# Patient Record
Sex: Female | Born: 1940
Health system: Southern US, Community
[De-identification: ages and names within clinical notes are randomized; demographics above are authoritative.]

## PROBLEM LIST (undated history)

## (undated) ENCOUNTER — Inpatient Hospital Stay (AMBULATORY_SURGERY_CENTER): Payer: Medicare Other | Admitting: Podiatry

## (undated) DIAGNOSIS — K743 Primary biliary cirrhosis: Secondary | ICD-10-CM

## (undated) DIAGNOSIS — I10 Essential (primary) hypertension: Secondary | ICD-10-CM

## (undated) DIAGNOSIS — M479 Spondylosis, unspecified: Secondary | ICD-10-CM

## (undated) DIAGNOSIS — E7800 Pure hypercholesterolemia, unspecified: Secondary | ICD-10-CM

## (undated) DIAGNOSIS — Z9889 Other specified postprocedural states: Secondary | ICD-10-CM

## (undated) DIAGNOSIS — E039 Hypothyroidism, unspecified: Secondary | ICD-10-CM

## (undated) DIAGNOSIS — K219 Gastro-esophageal reflux disease without esophagitis: Secondary | ICD-10-CM

## (undated) DIAGNOSIS — R112 Nausea with vomiting, unspecified: Secondary | ICD-10-CM

## (undated) DIAGNOSIS — N189 Chronic kidney disease, unspecified: Secondary | ICD-10-CM

## (undated) DIAGNOSIS — M797 Fibromyalgia: Secondary | ICD-10-CM

## (undated) DIAGNOSIS — D649 Anemia, unspecified: Secondary | ICD-10-CM

## (undated) HISTORY — DX: Spondylosis, unspecified: M47.9

## (undated) HISTORY — PX: BUNIONECTOMY: SHX129

## (undated) HISTORY — PX: TONSILLECTOMY: SUR1361

## (undated) HISTORY — PX: CHOLECYSTECTOMY: SHX55

## (undated) HISTORY — DX: Primary biliary cirrhosis: K74.3

## (undated) HISTORY — DX: Essential (primary) hypertension: I10

## (undated) HISTORY — PX: MASTOIDECTOMY: SHX711

## (undated) HISTORY — PX: APPENDECTOMY: SHX54

## (undated) HISTORY — DX: Fibromyalgia: M79.7

## (undated) HISTORY — PX: ABDOMINAL HYSTERECTOMY: SHX81

## (undated) HISTORY — PX: OTHER SURGICAL HISTORY: SHX169

## (undated) HISTORY — DX: Pure hypercholesterolemia, unspecified: E78.00

## (undated) HISTORY — DX: Hypothyroidism, unspecified: E03.9

---

## 2009-04-26 ENCOUNTER — Ambulatory Visit: Payer: Self-pay | Admitting: Surgery

## 2010-09-26 NOTE — Consult Note (Signed)
NEW PATIENT CONSULTATION   Jill Fletcher, Jill Fletcher  DOB:  09/02/1940                                        April 26, 2009  CHART #:  [id number]   REFERRING PHYSICIAN:  Dr. Ernestene Kiel.   REASON FOR CONSULTATION:  Interatrial septal aneurysm with mitral and  aortic regurgitation.   CLINICAL HISTORY:  I was asked by Dr. Laqueta Due to evaluate the patient  for the above problems.  She is a very nice 70 year old woman with a  history of hypertension and hypothyroidism as well as hyperlipidemia and  fibromyalgia who has had a lot of aches and pains over the years felt  secondary to fibromyalgia.  She describes having a cardiac  catheterization about 20 years ago when she presented with chest pain  and said that the catheterization was completely normal and she was  diagnosed with fibromyalgia.  She reports that over the past 6 months or  so she has had multiple episodes of double vision.  She said the worst  episode lasted for a couple days.  She had no other symptoms associated  with this.  She underwent a carotid ultrasound which showed no  significant stenosis.  She also underwent an MRI of brain that showed no  abnormality.  She had an echocardiogram performed at [facility]  on March 31, 2009.  This showed some bulging of the interatrial  septum suggesting possible aneurysmal changes.  No PFO was seen.  There  was mild mitral, tricuspid, and aortic insufficiency.  There was normal  left ventricular systolic function.  She brought a copy of the  echocardiogram with her today and I have reviewed that.  She was seen by  her ophthalmologist concerning this double vision and he could find no  definite cause for it although she does have a history of cataract.  She  also reports that over the past 6 months or so she has had increasing  exertional fatigue and shortness of breath.  She does report some chest  pains in multiple locations but said this is more  of an aching than a  pain and is not associated necessarily with any activity.  She felt that  these pains were most likely secondary to her fibromyalgia.   REVIEW OF SYSTEMS:  GENERAL:  She denies any fever or chills.  She has  had no recent weight changes.  She has a good appetite.  She does report  fatigue.  EYES:  As above.  She has had episodes of double vision and has a  history of cataracts.  ENT:  Negative.  ENDOCRINE:  She denies diabetes and does have a history of  hypothyroidism.  CARDIOVASCULAR:  She has intermittent aching chest pain in multiple  locations.  She said sometimes it feels like a pressure.  It has been  associated with exertion and at rest.  She does have exertional dyspnea.  She denies PND or orthopnea.  She has had no peripheral edema or  palpitations.  RESPIRATORY:  She denies cough and sputum production.  GI:  She has had no nausea or vomiting.  She denies melena or bright red  blood per rectum.  GU:  She denies dysuria and hematuria.  VASCULAR:  She denies claudication.  NEUROLOGIC:  She denies any focal weakness or numbness.  She does have a  history of headaches.  She has had no focal weakness or numbness.  She  has never had a TIA or a stroke.  MUSCULOSKELETAL:  She does report arthralgias and myalgias which are  chronic.  PSYCHIATRIC:  Negative.  HEMATOLOGIC:  Negative.   PAST MEDICAL HISTORY:  Significant for fibromyalgia for many years.  She  has a history of primary biliary cirrhosis.  She has had history of  hypercholesterolemia but has not been treated with statins due to her  primary biliary cirrhosis.  She has a history of hypertension.  She has  a history of hypothyroidism.  She has had 4 back surgeries in the past.   SOCIAL HISTORY:  She is married and lives with her husband.  She has  never smoked.  Denies alcohol use.   FAMILY HISTORY:  Negative for cardiac disease.   ALLERGIES:  Penicillin, codeine, mycins, and cyclins.    MEDICATIONS:  1. Protonix 40 mg daily.  2. Aspirin 81 mg daily.  3. Omega-3 1000 mg b.i.d.  4. Multivitamin daily.  5. Calcium plus D 200 mg b.i.d.  6. Vitamin C 500 mg daily.  7. Toprol-XL 50 mg daily.  8. Levothyroxine 75 mcg daily.  9. Magnesium 250 mg daily.  10.B complex vitamin daily.  11.Vitamin D3 daily.  12.Fiber.  13.She is also on Urso Forte 500 mg b.i.d.  14.Vivelle-Dot 0.05 mg for 24-hour patch biweekly.   PHYSICAL EXAMINATION:  Vital Signs:  Blood pressure 132/77, pulse 66 and  regular, respiratory rate 16 and unlabored.  Oxygen saturation on room  air is 98%.  General:  She is a well-developed white female in no  distress.  HEENT:  Normocephalic and atraumatic.  Pupils are equal and  reactive to light and accommodation.  Extraocular muscles are intact.  Her throat is clear.  Neck:  Normal carotid pulses bilaterally.  There  are no bruits.  There is no adenopathy or thyromegaly.  Cardiac:  Regular rate and rhythm with normal S1 and S2.  There is no murmur, rub,  or gallop.  Lungs:  Clear.  Abdomen:  Active bowel sounds.  Abdomen is  soft and nontender.  There are no palpable masses or organomegaly.  Back:  Scar over her lower spine from previous surgery.  Extremities:  No peripheral edema.  Pedal pulses are palpable bilaterally.  Skin:  Warm and dry.  Neurologic:  Alert and oriented x3.  Motor and sensory  exams are grossly normal.   IMPRESSION:  The patient has a bulging interatrial septum that may be  aneurysmal, but there is no evidence of a PFO or AST and I do not think  this requires any intervention.  She has very mild regurgitation through  her mitral, aortic, and tricuspid valves which does not require  intervention.  She does have significant symptoms of exertional fatigue  and shortness of breath which have been worsening.  These certainly  could be due to her fibromyalgia, but I think it is probably wise to  assess her for a cardiac etiology given her  history of hypertension and  untreated hyperlipidemia.  I discussed this with her and her husband,  and we decided to refer her to cardiology for further evaluation which  may require a stress test.  I have set up an appointment for her to see  Dr. Peter Martinique with [facility] in the near future.  I told  her I did not think that this interatrial septal aneurysm has anything  to  do with her blurred vision.  The etiology of her visual changes is  unclear.  If she had a patent foramen ovale, I would be more concerned  about possible embolic emboli with a history of blurred vision and  headaches but that was not seen by echocardiogram.  She is planning on  continuing to follow up with her ophthalmologist concerning her vision  and said that he may decide to operate on her cataracts.  I will await  Dr. Doug Sou consultation.   Gilford Raid, M.D.  Electronically Signed   BB/MEDQ  D:  04/26/2009  T:  04/27/2009  Job:  [id number]   cc:   Caroline Prochnau  Peter M. Martinique, M.D.

## 2011-06-11 DIAGNOSIS — I10 Essential (primary) hypertension: Secondary | ICD-10-CM | POA: Diagnosis not present

## 2011-06-11 DIAGNOSIS — K219 Gastro-esophageal reflux disease without esophagitis: Secondary | ICD-10-CM | POA: Diagnosis not present

## 2011-06-11 DIAGNOSIS — E039 Hypothyroidism, unspecified: Secondary | ICD-10-CM | POA: Diagnosis not present

## 2011-06-27 DIAGNOSIS — R1011 Right upper quadrant pain: Secondary | ICD-10-CM | POA: Diagnosis not present

## 2011-06-27 DIAGNOSIS — R1084 Generalized abdominal pain: Secondary | ICD-10-CM | POA: Diagnosis not present

## 2011-06-27 DIAGNOSIS — N3000 Acute cystitis without hematuria: Secondary | ICD-10-CM | POA: Diagnosis not present

## 2011-08-03 DIAGNOSIS — R110 Nausea: Secondary | ICD-10-CM | POA: Diagnosis not present

## 2011-08-03 DIAGNOSIS — R109 Unspecified abdominal pain: Secondary | ICD-10-CM | POA: Diagnosis not present

## 2011-08-08 DIAGNOSIS — R109 Unspecified abdominal pain: Secondary | ICD-10-CM | POA: Diagnosis not present

## 2011-08-08 DIAGNOSIS — R112 Nausea with vomiting, unspecified: Secondary | ICD-10-CM | POA: Diagnosis not present

## 2011-09-17 DIAGNOSIS — N3000 Acute cystitis without hematuria: Secondary | ICD-10-CM | POA: Diagnosis not present

## 2011-09-26 DIAGNOSIS — R51 Headache: Secondary | ICD-10-CM | POA: Diagnosis not present

## 2011-10-03 DIAGNOSIS — M25569 Pain in unspecified knee: Secondary | ICD-10-CM | POA: Diagnosis not present

## 2011-10-04 DIAGNOSIS — R945 Abnormal results of liver function studies: Secondary | ICD-10-CM | POA: Diagnosis not present

## 2011-10-24 ENCOUNTER — Encounter: Payer: Self-pay | Admitting: Cardiology

## 2011-11-01 DIAGNOSIS — J189 Pneumonia, unspecified organism: Secondary | ICD-10-CM | POA: Diagnosis not present

## 2011-11-01 DIAGNOSIS — Z883 Allergy status to other anti-infective agents status: Secondary | ICD-10-CM | POA: Diagnosis not present

## 2011-11-01 DIAGNOSIS — E039 Hypothyroidism, unspecified: Secondary | ICD-10-CM | POA: Diagnosis not present

## 2011-11-01 DIAGNOSIS — J069 Acute upper respiratory infection, unspecified: Secondary | ICD-10-CM | POA: Diagnosis not present

## 2011-11-01 DIAGNOSIS — I10 Essential (primary) hypertension: Secondary | ICD-10-CM | POA: Diagnosis not present

## 2011-11-01 DIAGNOSIS — R05 Cough: Secondary | ICD-10-CM | POA: Diagnosis not present

## 2011-11-01 DIAGNOSIS — K745 Biliary cirrhosis, unspecified: Secondary | ICD-10-CM | POA: Diagnosis not present

## 2011-11-01 DIAGNOSIS — K219 Gastro-esophageal reflux disease without esophagitis: Secondary | ICD-10-CM | POA: Diagnosis not present

## 2011-11-01 DIAGNOSIS — Z885 Allergy status to narcotic agent status: Secondary | ICD-10-CM | POA: Diagnosis not present

## 2011-11-01 DIAGNOSIS — Z880 Allergy status to penicillin: Secondary | ICD-10-CM | POA: Diagnosis not present

## 2011-11-30 DIAGNOSIS — Z1231 Encounter for screening mammogram for malignant neoplasm of breast: Secondary | ICD-10-CM | POA: Diagnosis not present

## 2011-12-03 DIAGNOSIS — R300 Dysuria: Secondary | ICD-10-CM | POA: Diagnosis not present

## 2012-02-01 DIAGNOSIS — Z23 Encounter for immunization: Secondary | ICD-10-CM | POA: Diagnosis not present

## 2012-02-15 DIAGNOSIS — E039 Hypothyroidism, unspecified: Secondary | ICD-10-CM | POA: Diagnosis not present

## 2012-03-26 DIAGNOSIS — J069 Acute upper respiratory infection, unspecified: Secondary | ICD-10-CM | POA: Diagnosis not present

## 2012-06-05 DIAGNOSIS — H2589 Other age-related cataract: Secondary | ICD-10-CM | POA: Diagnosis not present

## 2012-06-27 DIAGNOSIS — M1710 Unilateral primary osteoarthritis, unspecified knee: Secondary | ICD-10-CM | POA: Diagnosis not present

## 2012-07-08 ENCOUNTER — Encounter: Payer: Self-pay | Admitting: Cardiology

## 2012-07-11 DIAGNOSIS — M1710 Unilateral primary osteoarthritis, unspecified knee: Secondary | ICD-10-CM | POA: Diagnosis not present

## 2012-07-23 DIAGNOSIS — Z01818 Encounter for other preprocedural examination: Secondary | ICD-10-CM | POA: Diagnosis not present

## 2012-07-23 DIAGNOSIS — I10 Essential (primary) hypertension: Secondary | ICD-10-CM | POA: Diagnosis not present

## 2012-07-23 DIAGNOSIS — K745 Biliary cirrhosis, unspecified: Secondary | ICD-10-CM | POA: Diagnosis not present

## 2012-07-23 DIAGNOSIS — E039 Hypothyroidism, unspecified: Secondary | ICD-10-CM | POA: Diagnosis not present

## 2012-07-23 DIAGNOSIS — J019 Acute sinusitis, unspecified: Secondary | ICD-10-CM | POA: Diagnosis not present

## 2012-07-30 ENCOUNTER — Encounter (HOSPITAL_COMMUNITY): Payer: Self-pay | Admitting: Pharmacy Technician

## 2012-08-04 NOTE — Progress Notes (Signed)
Surgery clearance note Dr. Laqueta Due 07/23/12 on chart

## 2012-08-05 ENCOUNTER — Encounter (HOSPITAL_COMMUNITY): Payer: Self-pay

## 2012-08-05 ENCOUNTER — Other Ambulatory Visit (HOSPITAL_COMMUNITY): Payer: Self-pay | Admitting: Orthopedic Surgery

## 2012-08-05 ENCOUNTER — Encounter (HOSPITAL_COMMUNITY)
Admission: RE | Admit: 2012-08-05 | Discharge: 2012-08-05 | Disposition: A | Discharge status: Home / Self Care | Payer: Medicare Other | Source: Ambulatory Visit | Attending: Orthopedic Surgery | Admitting: Orthopedic Surgery

## 2012-08-05 ENCOUNTER — Ambulatory Visit (HOSPITAL_COMMUNITY)
Admission: RE | Admit: 2012-08-05 | Discharge: 2012-08-05 | Disposition: A | Discharge status: Home / Self Care | Payer: Medicare Other | Source: Ambulatory Visit | Attending: Orthopedic Surgery | Admitting: Orthopedic Surgery

## 2012-08-05 DIAGNOSIS — Z981 Arthrodesis status: Secondary | ICD-10-CM | POA: Insufficient documentation

## 2012-08-05 DIAGNOSIS — Z01818 Encounter for other preprocedural examination: Secondary | ICD-10-CM | POA: Insufficient documentation

## 2012-08-05 DIAGNOSIS — Z01812 Encounter for preprocedural laboratory examination: Secondary | ICD-10-CM | POA: Insufficient documentation

## 2012-08-05 DIAGNOSIS — Z9089 Acquired absence of other organs: Secondary | ICD-10-CM | POA: Diagnosis not present

## 2012-08-05 HISTORY — DX: Nausea with vomiting, unspecified: R11.2

## 2012-08-05 HISTORY — DX: Other specified postprocedural states: Z98.890

## 2012-08-05 HISTORY — DX: Gastro-esophageal reflux disease without esophagitis: K21.9

## 2012-08-05 LAB — PROTIME-INR
INR: 0.87 (ref 0.00–1.49)
Prothrombin Time: 11.8 seconds (ref 11.6–15.2)

## 2012-08-05 LAB — URINALYSIS, ROUTINE W REFLEX MICROSCOPIC
Bilirubin Urine: NEGATIVE
Specific Gravity, Urine: 1.012 (ref 1.005–1.030)
pH: 6.5 (ref 5.0–8.0)

## 2012-08-05 LAB — ABO/RH: ABO/RH(D): O POS

## 2012-08-05 LAB — URINE MICROSCOPIC-ADD ON

## 2012-08-05 MED ORDER — CHLORHEXIDINE GLUCONATE 4 % EX LIQD: 60.0000 mL | Freq: Once | CUTANEOUS | Status: DC

## 2012-08-05 NOTE — Patient Instructions (Addendum)
20 Jill Fletcher  08/05/2012   Your procedure is scheduled on: 08-12-2012  Report to [facility] at [place] AM.  Call this number if you have problems the morning of surgery [phone]   Remember:   Do not eat food or drink liquids :After Midnight.      Take these medicines the morning of surgery with A SIP OF WATER: toprol xl, synthroid, protonix, ursodiol                                SEE [place] PREPARING FOR SURGERY SHEET   Do not wear jewelry, make-up or nail polish.  Do not wear lotions, powders, or perfumes. You may wear deodorant.   Men may shave face and neck.  Do not bring valuables to the hospital.  Contacts, dentures or bridgework may not be worn into surgery.  Leave suitcase in the car. After surgery it may be brought to your room.  For patients admitted to the hospital, checkout time is 11:00 AM the day of discharge.   Patients discharged the day of surgery will not be allowed to drive home.  Name and phone number of your driver:  Special Instructions: N/A   Please read over the following fact sheets that you were given: MRSA Information, blood fact sheet, incentive spirometer fact sheet  Call Zelphia Cairo RN pre op nurse if needed [phone]    [place]. PATIENT SIGNATURE___________________________________________

## 2012-08-05 NOTE — Progress Notes (Signed)
Micro, ua results faxed to dr Alvan Dame by epic

## 2012-08-05 NOTE — Progress Notes (Addendum)
ekg 3-12 2014 dr Trenton Founds on chart,  Cbc with dif, and cmey 3-12 2-14 dr Baltazar Apo on chart  Back xray 08-03-2004 results placed on pt chart per dr Alvan Dame request

## 2012-08-10 NOTE — H&P (Signed)
TOTAL KNEE ADMISSION H&P  Patient is being admitted for left total knee arthroplasty.  Subjective:  Chief Complaint:  Left knee OA / pain.  HPI: Jill Fletcher, 72 y.o. female, has a history of pain and functional disability in the left knee due to arthritis and has failed non-surgical conservative treatments for greater than 12 weeks to includeNSAID's and/or analgesics and activity modification.  Onset of symptoms was gradual, starting 3 years ago with gradually worsening course since that time. The patient noted no past surgery on the left knee(s).  Patient currently rates pain in the left knee(s) at 8 out of 10 with activity. Patient has night pain, worsening of pain with activity and weight bearing, pain that interferes with activities of daily living, pain with passive range of motion, crepitus and joint swelling.  Patient has evidence of periarticular osteophytes and joint space narrowing by imaging studies. There is no active infection.  Risks, benefits and expectations were discussed with the patient. Patient understand the risks, benefits and expectations and wishes to proceed with surgery.   D/C Plans:   Home with HHPT  Post-op Meds:    Rx given for ASA, Robaxin, Iron, Colace and MiraLax  Tranexamic Acid:   To be given  Decadron:    To be given  FYI:    Lots of allergies, otherwise nothing to note.   Past Medical History  Diagnosis Date  . Hypertension   . Hypercholesteremia   . Primary biliary cirrhosis   . Hypothyroidism   . Osteoarthritis of spine   . Fibromyalgia   . GERD (gastroesophageal reflux disease)   . PONV (postoperative nausea and vomiting)     yrs ago, 08-03-2004 back xray on chart per dr Alvan Dame request    Past Surgical History  Procedure Laterality Date  . Tonsillectomy    . Appendectomy    . Hysterectomy----unknown    . Cholecystectomy    . Bunionectomy Bilateral yrs ago  . Mastoidectomy    . Lumbar surgery ----- unknown    . Right shoulder surgery   yrs ago  . Bladder tach  yrs ago  . Abdominal hysterectomy      No prescriptions prior to admission   Allergies  Allergen Reactions  . Doxycycline     And like medications. Heart pappitations and itching  . Levaquin (Levofloxacin) Other (See Comments)    Heart pounding and itching   . Other Itching    ALL MYCINS & CILLINS AND CYCLINS-CANT SLEEP AND HEART POUNDING  . Penicillins Other (See Comments)    Whelps at injections site and itching   . Codeine Palpitations    History  Substance Use Topics  . Smoking status: Never Smoker   . Smokeless tobacco: Never Used  . Alcohol Use: No    Family History  Problem Relation Age of Onset  . Diabetes Father   . Hypertension Mother   . Arthritis Mother   . Diabetes Brother      Review of Systems  Constitutional: Positive for chills.  HENT: Negative.   Eyes: Negative.   Respiratory: Negative.   Cardiovascular: Negative.   Gastrointestinal: Negative.   Genitourinary: Positive for urgency and frequency.  Musculoskeletal: Positive for myalgias, back pain and joint pain.  Skin: Negative.   Neurological: Negative.   Endo/Heme/Allergies: Negative.   Psychiatric/Behavioral: Negative.     Objective:  Physical Exam  Constitutional: She is oriented to person, place, and time. She appears well-developed and well-nourished.  HENT:  Head: Normocephalic and atraumatic.  Mouth/Throat: Oropharynx is clear and moist.  Eyes: Pupils are equal, round, and reactive to light.  Neck: Neck supple. No JVD present. No tracheal deviation present. No thyromegaly present.  Cardiovascular: Normal rate, regular rhythm, normal heart sounds and intact distal pulses.   Respiratory: Effort normal and breath sounds normal. No stridor. No respiratory distress. She has no wheezes.  GI: Soft. There is no tenderness. There is no guarding.  Musculoskeletal:       Left knee: She exhibits decreased range of motion, swelling, abnormal alignment (valgus alignment)  and bony tenderness. She exhibits no effusion, no ecchymosis, no laceration and no erythema. Tenderness found.  Lymphadenopathy:    She has no cervical adenopathy.  Neurological: She is alert and oriented to person, place, and time.  Skin: Skin is warm and dry.  Psychiatric: She has a normal mood and affect.     Imaging Review Plain radiographs demonstrate severe degenerative joint disease of the left knee(s). The overall alignment issignificant valgus. The bone quality appears to be good for age and reported activity level.  Assessment/Plan:  End stage arthritis, left knee   The patient history, physical examination, clinical judgment of the provider and imaging studies are consistent with end stage degenerative joint disease of the left knee(s) and total knee arthroplasty is deemed medically necessary. The treatment options including medical management, injection therapy arthroscopy and arthroplasty were discussed at length. The risks and benefits of total knee arthroplasty were presented and reviewed. The risks due to aseptic loosening, infection, stiffness, patella tracking problems, thromboembolic complications and other imponderables were discussed. The patient acknowledged the explanation, agreed to proceed with the plan and consent was signed. Patient is being admitted for inpatient treatment for surgery, pain control, PT, OT, prophylactic antibiotics, VTE prophylaxis, progressive ambulation and ADL's and discharge planning. The patient is planning to be discharged home with home health services.   [place] Aneesh Faller   PAC  08/10/2012, 7:31 PM

## 2012-08-12 ENCOUNTER — Encounter (HOSPITAL_COMMUNITY): Payer: Self-pay | Admitting: Anesthesiology

## 2012-08-12 ENCOUNTER — Encounter (HOSPITAL_COMMUNITY)
Admission: RE | Disposition: A | Discharge status: Home Health | Payer: Self-pay | Source: Ambulatory Visit | Attending: Orthopedic Surgery

## 2012-08-12 ENCOUNTER — Inpatient Hospital Stay (HOSPITAL_COMMUNITY)
Admission: RE | Admit: 2012-08-12 | Discharge: 2012-08-13 | DRG: 470 | Disposition: A | Discharge status: Home Health | Payer: Medicare Other | Source: Ambulatory Visit | Attending: Orthopedic Surgery | Admitting: Orthopedic Surgery

## 2012-08-12 ENCOUNTER — Encounter (HOSPITAL_COMMUNITY): Payer: Self-pay | Admitting: *Deleted

## 2012-08-12 ENCOUNTER — Inpatient Hospital Stay (HOSPITAL_COMMUNITY): Payer: Medicare Other | Admitting: Anesthesiology

## 2012-08-12 DIAGNOSIS — IMO0001 Reserved for inherently not codable concepts without codable children: Secondary | ICD-10-CM | POA: Diagnosis present

## 2012-08-12 DIAGNOSIS — K219 Gastro-esophageal reflux disease without esophagitis: Secondary | ICD-10-CM | POA: Diagnosis present

## 2012-08-12 DIAGNOSIS — E039 Hypothyroidism, unspecified: Secondary | ICD-10-CM | POA: Diagnosis present

## 2012-08-12 DIAGNOSIS — E7800 Pure hypercholesterolemia, unspecified: Secondary | ICD-10-CM | POA: Diagnosis present

## 2012-08-12 DIAGNOSIS — M1710 Unilateral primary osteoarthritis, unspecified knee: Principal | ICD-10-CM | POA: Diagnosis present

## 2012-08-12 DIAGNOSIS — E663 Overweight: Secondary | ICD-10-CM | POA: Diagnosis present

## 2012-08-12 DIAGNOSIS — E871 Hypo-osmolality and hyponatremia: Secondary | ICD-10-CM | POA: Diagnosis not present

## 2012-08-12 DIAGNOSIS — Z6825 Body mass index (BMI) 25.0-25.9, adult: Secondary | ICD-10-CM

## 2012-08-12 DIAGNOSIS — I10 Essential (primary) hypertension: Secondary | ICD-10-CM | POA: Diagnosis present

## 2012-08-12 DIAGNOSIS — IMO0002 Reserved for concepts with insufficient information to code with codable children: Secondary | ICD-10-CM | POA: Diagnosis not present

## 2012-08-12 DIAGNOSIS — Z96659 Presence of unspecified artificial knee joint: Secondary | ICD-10-CM

## 2012-08-12 DIAGNOSIS — Z96652 Presence of left artificial knee joint: Secondary | ICD-10-CM

## 2012-08-12 HISTORY — PX: TOTAL KNEE ARTHROPLASTY: SHX125

## 2012-08-12 LAB — TYPE AND SCREEN: Antibody Screen: NEGATIVE

## 2012-08-12 SURGERY — ARTHROPLASTY, KNEE, TOTAL
Anesthesia: Spinal | Site: Knee | Laterality: Left | Wound class: Clean

## 2012-08-12 MED ORDER — PROMETHAZINE HCL 25 MG/ML IJ SOLN: 6.2500 mg | INTRAMUSCULAR | Status: DC | PRN

## 2012-08-12 MED ORDER — TRAMADOL HCL 50 MG PO TABS: 50.0000 mg | ORAL_TABLET | Freq: Four times a day (QID) | ORAL | Status: DC | PRN

## 2012-08-12 MED ORDER — DIPHENHYDRAMINE HCL 25 MG PO CAPS: 25.0000 mg | ORAL_CAPSULE | Freq: Four times a day (QID) | ORAL | Status: DC | PRN

## 2012-08-12 MED ORDER — SODIUM CHLORIDE 0.9 % IR SOLN
Status: DC | PRN
  Administered 2012-08-12: 1000 mL

## 2012-08-12 MED ORDER — POLYETHYLENE GLYCOL 3350 17 G PO PACK
17.0000 g | PACK | Freq: Two times a day (BID) | ORAL | Status: DC
  Administered 2012-08-12 – 2012-08-13 (×2): 17 g via ORAL

## 2012-08-12 MED ORDER — MEPERIDINE HCL 50 MG/ML IJ SOLN: 6.2500 mg | INTRAMUSCULAR | Status: DC | PRN

## 2012-08-12 MED ORDER — CEFAZOLIN SODIUM-DEXTROSE 2-3 GM-% IV SOLR
2.0000 g | Freq: Once | INTRAVENOUS | Status: AC
  Administered 2012-08-12: 2 g via INTRAVENOUS

## 2012-08-12 MED ORDER — ACETAMINOPHEN 10 MG/ML IV SOLN
INTRAVENOUS | Status: AC
  Filled 2012-08-12: qty 100

## 2012-08-12 MED ORDER — LACTATED RINGERS IV SOLN
INTRAVENOUS | Status: DC | PRN
  Administered 2012-08-12 (×2): via INTRAVENOUS

## 2012-08-12 MED ORDER — URSODIOL 300 MG PO CAPS
300.0000 mg | ORAL_CAPSULE | Freq: Two times a day (BID) | ORAL | Status: DC
  Administered 2012-08-12 – 2012-08-13 (×2): 300 mg via ORAL
  Filled 2012-08-12 (×3): qty 1

## 2012-08-12 MED ORDER — RIVAROXABAN 10 MG PO TABS
10.0000 mg | ORAL_TABLET | ORAL | Status: DC
  Administered 2012-08-13: 10 mg via ORAL
  Filled 2012-08-12 (×2): qty 1

## 2012-08-12 MED ORDER — TRANEXAMIC ACID 100 MG/ML IV SOLN
1000.0000 mg | Freq: Once | INTRAVENOUS | Status: AC
  Administered 2012-08-12: 1000 mg via INTRAVENOUS
  Filled 2012-08-12: qty 10

## 2012-08-12 MED ORDER — FERROUS SULFATE 325 (65 FE) MG PO TABS
325.0000 mg | ORAL_TABLET | Freq: Three times a day (TID) | ORAL | Status: DC
  Administered 2012-08-12 – 2012-08-13 (×2): 325 mg via ORAL
  Filled 2012-08-12 (×5): qty 1

## 2012-08-12 MED ORDER — URSODIOL 60 MG/ML SUSP
500.0000 mg | Freq: Two times a day (BID) | ORAL | Status: DC
  Filled 2012-08-12: qty 16.7

## 2012-08-12 MED ORDER — PHENYLEPHRINE HCL 10 MG/ML IJ SOLN
INTRAMUSCULAR | Status: DC | PRN
  Administered 2012-08-12: 40 ug via INTRAVENOUS

## 2012-08-12 MED ORDER — PANTOPRAZOLE SODIUM 40 MG PO TBEC
40.0000 mg | DELAYED_RELEASE_TABLET | Freq: Every morning | ORAL | Status: DC
  Administered 2012-08-13: 40 mg via ORAL
  Filled 2012-08-12: qty 1

## 2012-08-12 MED ORDER — LEVOTHYROXINE SODIUM 75 MCG PO TABS
75.0000 ug | ORAL_TABLET | Freq: Every day | ORAL | Status: DC
  Administered 2012-08-13: 75 ug via ORAL
  Filled 2012-08-12 (×2): qty 1

## 2012-08-12 MED ORDER — KETOROLAC TROMETHAMINE 15 MG/ML IJ SOLN
7.5000 mg | Freq: Four times a day (QID) | INTRAMUSCULAR | Status: DC
  Administered 2012-08-12 – 2012-08-13 (×3): 7.5 mg via INTRAVENOUS
  Filled 2012-08-12 (×4): qty 1

## 2012-08-12 MED ORDER — SODIUM CHLORIDE 0.9 % IV SOLN
INTRAVENOUS | Status: DC
  Administered 2012-08-12 – 2012-08-13 (×2): via INTRAVENOUS
  Filled 2012-08-12 (×5): qty 1000

## 2012-08-12 MED ORDER — HYDROMORPHONE HCL PF 1 MG/ML IJ SOLN
0.5000 mg | INTRAMUSCULAR | Status: DC | PRN
  Administered 2012-08-12: 0.5 mg via INTRAVENOUS
  Filled 2012-08-12: qty 1

## 2012-08-12 MED ORDER — MAGNESIUM OXIDE 400 (241.3 MG) MG PO TABS
200.0000 mg | ORAL_TABLET | Freq: Every day | ORAL | Status: DC
  Administered 2012-08-13: 200 mg via ORAL
  Filled 2012-08-12 (×2): qty 0.5

## 2012-08-12 MED ORDER — MENTHOL 3 MG MT LOZG: 1.0000 | LOZENGE | OROMUCOSAL | Status: DC | PRN

## 2012-08-12 MED ORDER — OXYCODONE HCL 5 MG PO TABS
5.0000 mg | ORAL_TABLET | ORAL | Status: DC | PRN
  Administered 2012-08-12 – 2012-08-13 (×6): 5 mg via ORAL
  Filled 2012-08-12 (×6): qty 1

## 2012-08-12 MED ORDER — DOCUSATE SODIUM 100 MG PO CAPS
100.0000 mg | ORAL_CAPSULE | Freq: Two times a day (BID) | ORAL | Status: DC
  Administered 2012-08-12 – 2012-08-13 (×2): 100 mg via ORAL

## 2012-08-12 MED ORDER — CEFAZOLIN SODIUM-DEXTROSE 2-3 GM-% IV SOLR
2.0000 g | Freq: Four times a day (QID) | INTRAVENOUS | Status: AC
  Administered 2012-08-12 – 2012-08-13 (×2): 2 g via INTRAVENOUS
  Filled 2012-08-12 (×2): qty 50

## 2012-08-12 MED ORDER — URSODIOL 500 MG PO TABS: 500.0000 mg | ORAL_TABLET | Freq: Two times a day (BID) | ORAL | Status: DC

## 2012-08-12 MED ORDER — FENTANYL CITRATE 0.05 MG/ML IJ SOLN: 25.0000 ug | INTRAMUSCULAR | Status: DC | PRN

## 2012-08-12 MED ORDER — ALUM & MAG HYDROXIDE-SIMETH 200-200-20 MG/5ML PO SUSP: 30.0000 mL | ORAL | Status: DC | PRN

## 2012-08-12 MED ORDER — SODIUM CHLORIDE 0.9 % IJ SOLN
INTRAMUSCULAR | Status: DC | PRN
  Administered 2012-08-12: 12:00:00

## 2012-08-12 MED ORDER — EPHEDRINE SULFATE 50 MG/ML IJ SOLN
INTRAMUSCULAR | Status: DC | PRN
  Administered 2012-08-12 (×2): 5 mg via INTRAVENOUS

## 2012-08-12 MED ORDER — BISACODYL 10 MG RE SUPP: 10.0000 mg | Freq: Every day | RECTAL | Status: DC | PRN

## 2012-08-12 MED ORDER — MIDAZOLAM HCL 5 MG/5ML IJ SOLN
INTRAMUSCULAR | Status: DC | PRN
  Administered 2012-08-12: 1 mg via INTRAVENOUS

## 2012-08-12 MED ORDER — FENTANYL CITRATE 0.05 MG/ML IJ SOLN
INTRAMUSCULAR | Status: DC | PRN
  Administered 2012-08-12: 50 ug via INTRAVENOUS

## 2012-08-12 MED ORDER — LACTATED RINGERS IV SOLN: INTRAVENOUS | Status: DC

## 2012-08-12 MED ORDER — PHENOL 1.4 % MT LIQD: 1.0000 | OROMUCOSAL | Status: DC | PRN

## 2012-08-12 MED ORDER — FLEET ENEMA 7-19 GM/118ML RE ENEM: 1.0000 | ENEMA | Freq: Once | RECTAL | Status: AC | PRN

## 2012-08-12 MED ORDER — ZOLPIDEM TARTRATE 5 MG PO TABS: 5.0000 mg | ORAL_TABLET | Freq: Every evening | ORAL | Status: DC | PRN

## 2012-08-12 MED ORDER — DEXAMETHASONE SODIUM PHOSPHATE 10 MG/ML IJ SOLN
10.0000 mg | Freq: Once | INTRAMUSCULAR | Status: AC
  Administered 2012-08-12: 10 mg via INTRAVENOUS

## 2012-08-12 MED ORDER — MAGNESIUM 250 MG PO TABS: 250.0000 mg | ORAL_TABLET | Freq: Every day | ORAL | Status: DC

## 2012-08-12 MED ORDER — METHOCARBAMOL 500 MG PO TABS
500.0000 mg | ORAL_TABLET | Freq: Four times a day (QID) | ORAL | Status: DC | PRN
  Administered 2012-08-12: 500 mg via ORAL
  Filled 2012-08-12: qty 1

## 2012-08-12 MED ORDER — CLINDAMYCIN PHOSPHATE 900 MG/50ML IV SOLN: 900.0000 mg | INTRAVENOUS | Status: DC

## 2012-08-12 MED ORDER — METOPROLOL SUCCINATE ER 50 MG PO TB24
50.0000 mg | ORAL_TABLET | Freq: Every day | ORAL | Status: DC
  Administered 2012-08-13: 50 mg via ORAL
  Filled 2012-08-12: qty 1

## 2012-08-12 MED ORDER — METHOCARBAMOL 100 MG/ML IJ SOLN: 500.0000 mg | Freq: Four times a day (QID) | INTRAVENOUS | Status: DC | PRN

## 2012-08-12 MED ORDER — BUPIVACAINE LIPOSOME 1.3 % IJ SUSP
20.0000 mL | Freq: Once | INTRAMUSCULAR | Status: DC
  Filled 2012-08-12: qty 20

## 2012-08-12 MED ORDER — BUPIVACAINE IN DEXTROSE 0.75-8.25 % IT SOLN
INTRATHECAL | Status: DC | PRN
  Administered 2012-08-12: 1.6 mL via INTRATHECAL

## 2012-08-12 MED ORDER — ONDANSETRON HCL 4 MG PO TABS
4.0000 mg | ORAL_TABLET | Freq: Four times a day (QID) | ORAL | Status: DC | PRN
  Administered 2012-08-12: 4 mg via ORAL
  Filled 2012-08-12: qty 1

## 2012-08-12 MED ORDER — ONDANSETRON HCL 4 MG/2ML IJ SOLN: 4.0000 mg | Freq: Four times a day (QID) | INTRAMUSCULAR | Status: DC | PRN

## 2012-08-12 MED ORDER — PROPOFOL INFUSION 10 MG/ML OPTIME
INTRAVENOUS | Status: DC | PRN
  Administered 2012-08-12: 75 ug/kg/min via INTRAVENOUS

## 2012-08-12 MED ORDER — 0.9 % SODIUM CHLORIDE (POUR BTL) OPTIME
TOPICAL | Status: DC | PRN
  Administered 2012-08-12: 1000 mL

## 2012-08-12 MED ORDER — LACTATED RINGERS IV SOLN
INTRAVENOUS | Status: DC
Start: 2012-08-12 — End: 2012-08-12
  Administered 2012-08-12: 1000 mL via INTRAVENOUS

## 2012-08-12 SURGICAL SUPPLY — 56 items
BAG ZIPLOCK 12X15 (MISCELLANEOUS) ×2 IMPLANT
BANDAGE ELASTIC 6 VELCRO ST LF (GAUZE/BANDAGES/DRESSINGS) ×2 IMPLANT
BANDAGE ESMARK 6X9 LF (GAUZE/BANDAGES/DRESSINGS) ×1 IMPLANT
BLADE SAW SGTL 13.0X1.19X90.0M (BLADE) ×2 IMPLANT
BNDG ESMARK 6X9 LF (GAUZE/BANDAGES/DRESSINGS) ×2
BOWL SMART MIX CTS (DISPOSABLE) ×2 IMPLANT
CEMENT HV SMART SET (Cement) ×4 IMPLANT
CLOTH BEACON ORANGE TIMEOUT ST (SAFETY) ×2 IMPLANT
CUFF TOURN SGL QUICK 34 (TOURNIQUET CUFF) ×1
CUFF TRNQT CYL 34X4X40X1 (TOURNIQUET CUFF) ×1 IMPLANT
DECANTER SPIKE VIAL GLASS SM (MISCELLANEOUS) ×2 IMPLANT
DERMABOND ADVANCED (GAUZE/BANDAGES/DRESSINGS) ×1
DERMABOND ADVANCED .7 DNX12 (GAUZE/BANDAGES/DRESSINGS) ×1 IMPLANT
DRAPE EXTREMITY T 121X128X90 (DRAPE) ×2 IMPLANT
DRAPE POUCH INSTRU U-SHP 10X18 (DRAPES) ×2 IMPLANT
DRAPE U-SHAPE 47X51 STRL (DRAPES) ×2 IMPLANT
DRSG AQUACEL AG ADV 3.5X10 (GAUZE/BANDAGES/DRESSINGS) ×2 IMPLANT
DRSG TEGADERM 4X4.75 (GAUZE/BANDAGES/DRESSINGS) ×2 IMPLANT
DURAPREP 26ML APPLICATOR (WOUND CARE) ×2 IMPLANT
ELECT REM PT RETURN 9FT ADLT (ELECTROSURGICAL) ×2
ELECTRODE REM PT RTRN 9FT ADLT (ELECTROSURGICAL) ×1 IMPLANT
EVACUATOR 1/8 PVC DRAIN (DRAIN) ×2 IMPLANT
FACESHIELD LNG OPTICON STERILE (SAFETY) ×10 IMPLANT
GAUZE SPONGE 2X2 8PLY STRL LF (GAUZE/BANDAGES/DRESSINGS) ×1 IMPLANT
GLOVE BIOGEL PI IND STRL 7.5 (GLOVE) ×1 IMPLANT
GLOVE BIOGEL PI IND STRL 8 (GLOVE) ×1 IMPLANT
GLOVE BIOGEL PI INDICATOR 7.5 (GLOVE) ×1
GLOVE BIOGEL PI INDICATOR 8 (GLOVE) ×1
GLOVE ECLIPSE 8.0 STRL XLNG CF (GLOVE) ×2 IMPLANT
GLOVE ORTHO TXT STRL SZ7.5 (GLOVE) ×4 IMPLANT
GOWN BRE IMP PREV XXLGXLNG (GOWN DISPOSABLE) ×2 IMPLANT
GOWN STRL NON-REIN LRG LVL3 (GOWN DISPOSABLE) ×2 IMPLANT
HANDPIECE INTERPULSE COAX TIP (DISPOSABLE) ×1
IMMOBILIZER KNEE 20 (SOFTGOODS) ×2
IMMOBILIZER KNEE 20 THIGH 36 (SOFTGOODS) ×1 IMPLANT
KIT BASIN OR (CUSTOM PROCEDURE TRAY) ×2 IMPLANT
MANIFOLD NEPTUNE II (INSTRUMENTS) ×2 IMPLANT
NDL SAFETY ECLIPSE 18X1.5 (NEEDLE) ×1 IMPLANT
NEEDLE HYPO 18GX1.5 SHARP (NEEDLE) ×1
NS IRRIG 1000ML POUR BTL (IV SOLUTION) ×4 IMPLANT
PACK TOTAL JOINT (CUSTOM PROCEDURE TRAY) ×2 IMPLANT
POSITIONER SURGICAL ARM (MISCELLANEOUS) ×2 IMPLANT
SET HNDPC FAN SPRY TIP SCT (DISPOSABLE) ×1 IMPLANT
SET PAD KNEE POSITIONER (MISCELLANEOUS) ×2 IMPLANT
SPONGE GAUZE 2X2 STER 10/PKG (GAUZE/BANDAGES/DRESSINGS) ×1
SUCTION FRAZIER 12FR DISP (SUCTIONS) ×2 IMPLANT
SUT MNCRL AB 4-0 PS2 18 (SUTURE) ×2 IMPLANT
SUT VIC AB 1 CT1 36 (SUTURE) ×2 IMPLANT
SUT VIC AB 2-0 CT1 27 (SUTURE) ×3
SUT VIC AB 2-0 CT1 TAPERPNT 27 (SUTURE) ×3 IMPLANT
SUT VLOC 180 0 24IN GS25 (SUTURE) ×2 IMPLANT
SYR 50ML LL SCALE MARK (SYRINGE) ×2 IMPLANT
TOWEL OR 17X26 10 PK STRL BLUE (TOWEL DISPOSABLE) ×4 IMPLANT
TRAY FOLEY CATH 14FRSI W/METER (CATHETERS) ×2 IMPLANT
WATER STERILE IRR 1500ML POUR (IV SOLUTION) ×2 IMPLANT
WRAP KNEE MAXI GEL POST OP (GAUZE/BANDAGES/DRESSINGS) ×2 IMPLANT

## 2012-08-12 NOTE — Plan of Care (Signed)
Problem: Consults Goal: Diagnosis- Total Joint Replacement Primary Total Knee     

## 2012-08-12 NOTE — Anesthesia Postprocedure Evaluation (Signed)
  Anesthesia Post-op Note  Patient: Jill Fletcher  Procedure(s) Performed: Procedure(s) (LRB): TOTAL KNEE ARTHROPLASTY (Left)  Patient Location: PACU  Anesthesia Type: Spinal  Level of Consciousness: awake and alert   Airway and Oxygen Therapy: Patient Spontanous Breathing  Post-op Pain: mild  Post-op Assessment: Post-op Vital signs reviewed, Patient's Cardiovascular Status Stable, Respiratory Function Stable, Patent Airway and No signs of Nausea or vomiting  Last Vitals:  Filed Vitals:   08/12/12 1315  BP: 130/50  Pulse: 68  Temp:   Resp: 13    Post-op Vital Signs: stable   Complications: No apparent anesthesia complications \

## 2012-08-12 NOTE — Progress Notes (Signed)
Patient states she took Ceftin approximately two weeks ago with no problems.  She states that she had a whelp at the injection site in the 1960's while in [place] after receiving a Penicillin injection.  She also states she has had several Cephalosporins with no problems since the 1960's reaction.  Dr Paralee Cancel, MD gave a verbal for Ancef 2 Grams IV for surgery.

## 2012-08-12 NOTE — Interval H&P Note (Signed)
History and Physical Interval Note:  08/12/2012 8:31 AM  Nunzio Cory  has presented today for surgery, with the diagnosis of LEFT KNEE OA  The various methods of treatment have been discussed with the patient and family. After consideration of risks, benefits and other options for treatment, the patient has consented to  Procedure(s): TOTAL KNEE ARTHROPLASTY (Left) as a surgical intervention .  The patient's history has been reviewed, patient examined, no change in status, stable for surgery.  I have reviewed the patient's chart and labs.  Questions were answered to the patient's satisfaction.     Mauri Pole

## 2012-08-12 NOTE — Preoperative (Signed)
Beta Blockers   Reason not to administer Beta Blockers:Not Applicable 

## 2012-08-12 NOTE — Anesthesia Procedure Notes (Signed)
Spinal  Patient location during procedure: OR Staffing Anesthesiologist: Kostas Marrow Performed by: anesthesiologist  Preanesthetic Checklist Completed: patient identified, site marked, surgical consent, pre-op evaluation, timeout performed, IV checked, risks and benefits discussed and monitors and equipment checked Spinal Block Patient position: sitting Prep: Betadine Patient monitoring: heart rate, continuous pulse ox and blood pressure Approach: right paramedian Location: L2-3 Injection technique: single-shot Needle Needle type: Spinocan  Needle gauge: 22 G Needle length: 9 cm Additional Notes Expiration date of kit checked and confirmed. Patient tolerated procedure well, without complications.     

## 2012-08-12 NOTE — Transfer of Care (Signed)
Immediate Anesthesia Transfer of Care Note  Patient: Jill Fletcher  Procedure(s) Performed: Procedure(s): TOTAL KNEE ARTHROPLASTY (Left)  Patient Location: PACU  Anesthesia Type:Spinal  Level of Consciousness: awake, alert , oriented and patient cooperative  Airway & Oxygen Therapy: Patient Spontanous Breathing and Patient connected to face mask oxygen  Post-op Assessment: Report given to PACU RN and Post -op Vital signs reviewed and stable  Post vital signs: Reviewed and stable  Complications: No apparent anesthesia complications

## 2012-08-12 NOTE — Progress Notes (Signed)
Utilization review completed.  

## 2012-08-12 NOTE — Op Note (Signed)
NAME:  [place] RECORD NO.:  [id number]                             FACILITY:  [facility]      PHYSICIAN:  Pietro Cassis. Alvan Dame, M.D.  DATE OF BIRTH:  [birth year]      DATE OF PROCEDURE:  08/12/2012                                     OPERATIVE REPORT         PREOPERATIVE DIAGNOSIS:  Left knee osteoarthritis.      POSTOPERATIVE DIAGNOSIS:  Left knee osteoarthritis.      FINDINGS:  The patient was noted to have complete loss of cartilage and   bone-on-bone arthritis with associated osteophytes in the lateral and patellofemoral compartments of   the knee with a significant synovitis and associated effusion.      PROCEDURE:  Left total knee replacement.      COMPONENTS USED:  DePuy rotating platform posterior stabilized knee   system, a size 4N femur, 3 tibia, 10 mm PS insert, and 35 patellar   button.      SURGEON:  Pietro Cassis. Alvan Dame, M.D.      ASSISTANT:  Danae Orleans, PA-C.      ANESTHESIA:  Spinal.      SPECIMENS:  None.      COMPLICATION:  None.      DRAINS:  One Hemovac.  EBL: <100cc      TOURNIQUET TIME:   Total Tourniquet Time Documented: Thigh (Left) - 36 minutes Total: Thigh (Left) - 36 minutes  .      The patient was stable to the recovery room.      INDICATION FOR PROCEDURE:  SARH MABERY is a 72 y.o. female patient of   mine.  The patient had been seen, evaluated, and treated conservatively in the   office with medication, activity modification, and injections.  The patient had   radiographic changes of bone-on-bone arthritis with endplate sclerosis and osteophytes noted.      The patient failed conservative measures including medication, injections, and activity modification, and at this point was ready for more definitive measures.   Based on the radiographic changes and failed conservative measures, the patient   decided to proceed with total knee replacement.  Risks of infection,   DVT, component failure, need for  revision surgery, postop course, and   expectations were all   discussed and reviewed.  Consent was obtained for benefit of pain   relief.      PROCEDURE IN DETAIL:  The patient was brought to the operative theater.   Once adequate anesthesia, preoperative antibiotics, 2 gm of Ancef administered, the patient was positioned supine with the left thigh tourniquet placed.  The  left lower extremity was prepped and draped in sterile fashion.  A time-   out was performed identifying the patient, planned procedure, and   extremity.      The left lower extremity was placed in the [facility] leg holder.  The leg was   exsanguinated, tourniquet elevated to 250 mmHg.  A midline incision was   made followed by median parapatellar arthrotomy.  Following initial   exposure, attention was  first directed to the patella.  Precut   measurement was noted to be 22 mm.  I resected down to 14 mm and used a   35 patellar button to restore patellar height as well as cover the cut   surface.      The lug holes were drilled and a metal shim was placed to protect the   patella from retractors and saw blades.      At this point, attention was now directed to the femur.  The femoral   canal was opened with a drill, irrigated to try to prevent fat emboli.  An   intramedullary rod was passed at 5 degrees valgus, 9 mm of bone was   resected off the distal femur.  Following this resection, the tibia was   subluxated anteriorly.  Using the extramedullary guide, 6 mm of bone was resected off   the proximal lateral tibia.  We confirmed the gap would be   stable medially and laterally with a 10 mm insert as well as confirmed   the cut was perpendicular in the coronal plane, checking with an alignment rod.      Once this was done, I sized the femur to be a size 4 in the anterior-   posterior dimension, chose a narrow component based on medial and   lateral dimension.  The size 4 rotation block was then pinned in   position  anterior referenced using the C-clamp to set rotation.  The   anterior, posterior, and  chamfer cuts were made without difficulty nor   notching making certain that I was along the anterior cortex to help   with flexion gap stability.      The final box cut was made off the lateral aspect of distal femur.      At this point, the tibia was sized to be a size 3, the size 3 tray was   then pinned in position through the medial third of the tubercle,   drilled, and keel punched.  Trial reduction was now carried with a 4N femur,  3 tibia, a 10 mm insert, and the 35 patella botton.  The knee was brought to   extension, full extension with good flexion stability with the patella   tracking through the trochlea without application of pressure.  Given   all these findings, the trial components removed.  Final components were   opened and cement was mixed.  The knee was irrigated with normal saline   solution and pulse lavage.  The synovial lining was   then injected with 0.25% Marcaine with epinephrine and 1 cc of Toradol,   total of 61 cc.      The knee was irrigated.  Final implants were then cemented onto clean and   dried cut surfaces of bone with the knee brought to extension with a 10 mm trial insert.      Once the cement had fully cured, the excess cement was removed   throughout the knee.  I confirmed I was satisfied with the range of   motion and stability, and the final 10 mm PS insert was chosen.  It was   placed into the knee.      The tourniquet had been let down at 36 minutes.  No significant   hemostasis required.  The medium Hemovac drain was placed deep.  The   extensor mechanism was then reapproximated using #1 Vicryl with the knee   in flexion.  The  remaining wound was closed with 2-0 Vicryl and running 4-0 Monocryl.   The knee was cleaned, dried, dressed sterilely using Dermabond and   Aquacel dressing.  Drain site dressed separately.  The patient was then   brought to  recovery room in stable condition, tolerating the procedure   well.   Please note that Physician Assistant, Danae Orleans, was present for the entirety of the case, and was utilized for pre-operative positioning, peri-operative retractor management, general facilitation of the procedure.  He was also utilized for primary wound closure at the end of the case.              Pietro Cassis Alvan Dame, M.D.

## 2012-08-12 NOTE — Anesthesia Preprocedure Evaluation (Addendum)
Anesthesia Evaluation  Patient identified by MRN, date of birth, ID band Patient awake    Reviewed: Allergy & Precautions, H&P , NPO status , Patient's Chart, lab work & pertinent test results  History of Anesthesia Complications (+) PONV  Airway Mallampati: II TM Distance: >3 FB Neck ROM: Full    Dental no notable dental hx. (+) Partial Upper   Pulmonary neg pulmonary ROS,  breath sounds clear to auscultation  Pulmonary exam normal       Cardiovascular hypertension, Pt. on medications negative cardio ROS  Rhythm:Regular Rate:Normal     Neuro/Psych negative neurological ROS  negative psych ROS   GI/Hepatic negative GI ROS, Neg liver ROS, Primary biliary sclerosis   Endo/Other  negative endocrine ROSHypothyroidism   Renal/GU negative Renal ROS  negative genitourinary   Musculoskeletal negative musculoskeletal ROS (+) Fibromyalgia -  Abdominal   Peds negative pediatric ROS (+)  Hematology negative hematology ROS (+)   Anesthesia Other Findings Extensive dental work. Front two upper teeth capped  Reproductive/Obstetrics negative OB ROS                         Anesthesia Physical Anesthesia Plan  ASA: III  Anesthesia Plan: Spinal   Post-op Pain Management:    Induction:   Airway Management Planned: Simple Face Mask  Additional Equipment:   Intra-op Plan:   Post-operative Plan:   Informed Consent: I have reviewed the patients History and Physical, chart, labs and discussed the procedure including the risks, benefits and alternatives for the proposed anesthesia with the patient or authorized representative who has indicated his/her understanding and acceptance.   Dental advisory given  Plan Discussed with: CRNA  Anesthesia Plan Comments: (Previous back surgery. xrays reviewed. Will attempt to go above for SAB.  No tylenol)      Anesthesia Quick Evaluation

## 2012-08-13 ENCOUNTER — Encounter (HOSPITAL_COMMUNITY): Payer: Self-pay | Admitting: Orthopedic Surgery

## 2012-08-13 DIAGNOSIS — E871 Hypo-osmolality and hyponatremia: Secondary | ICD-10-CM | POA: Diagnosis not present

## 2012-08-13 DIAGNOSIS — E663 Overweight: Secondary | ICD-10-CM

## 2012-08-13 LAB — CBC
HCT: 37 % (ref 36.0–46.0)
Hemoglobin: 13 g/dL (ref 12.0–15.0)
MCH: 31.9 pg (ref 26.0–34.0)
MCHC: 35.1 g/dL (ref 30.0–36.0)
RDW: 14.1 % (ref 11.5–15.5)

## 2012-08-13 LAB — BASIC METABOLIC PANEL WITH GFR
BUN: 13 mg/dL (ref 6–23)
CO2: 19 meq/L (ref 19–32)
Calcium: 8.6 mg/dL (ref 8.4–10.5)
Chloride: 107 meq/L (ref 96–112)
Creatinine, Ser: 1.01 mg/dL (ref 0.50–1.10)
GFR calc Af Amer: 63 mL/min — ABNORMAL LOW
GFR calc non Af Amer: 55 mL/min — ABNORMAL LOW
Glucose, Bld: 115 mg/dL — ABNORMAL HIGH (ref 70–99)
Potassium: 3.8 meq/L (ref 3.5–5.1)
Sodium: 133 meq/L — ABNORMAL LOW (ref 135–145)

## 2012-08-13 MED ORDER — POLYETHYLENE GLYCOL 3350 17 G PO PACK: 17.0000 g | PACK | Freq: Two times a day (BID) | ORAL | Status: AC

## 2012-08-13 MED ORDER — DSS 100 MG PO CAPS: 100.0000 mg | ORAL_CAPSULE | Freq: Two times a day (BID) | ORAL | Status: AC

## 2012-08-13 MED ORDER — FERROUS SULFATE 325 (65 FE) MG PO TABS: 325.0000 mg | ORAL_TABLET | Freq: Three times a day (TID) | ORAL | Status: DC

## 2012-08-13 MED ORDER — ASPIRIN EC 325 MG PO TBEC: 325.0000 mg | DELAYED_RELEASE_TABLET | Freq: Two times a day (BID) | ORAL | Status: DC

## 2012-08-13 MED ORDER — OXYCODONE HCL 5 MG PO TABS: 5.0000 mg | ORAL_TABLET | ORAL | Status: DC | PRN

## 2012-08-13 MED ORDER — TIZANIDINE HCL 4 MG PO CAPS: 4.0000 mg | ORAL_CAPSULE | Freq: Three times a day (TID) | ORAL | Status: DC

## 2012-08-13 NOTE — Care Management Note (Signed)
    Page 1 of 1   08/13/2012     2:31:29 PM   CARE MANAGEMENT NOTE 08/13/2012  Patient:  ELBA, CRUCE   Account Number:  [id number]  Date Initiated:  08/13/2012  Documentation initiated by:  Sherrin Daisy  Subjective/Objective Assessment:   DX LEFT KNEE OSTEOAQRTHRITIS; TOTAL KNEE REPLACEMNT    Per-arranged with Arville Go for [facility] services prior to admission. Services will start day pt is discharged.     Action/Plan:   CM spoke with patient. Plans are for patient to return to her home in [place] where spouse will be caregiver. Already has RW. Arville Go will provide [facility] services.   Anticipated DC Date:  08/13/2012   Anticipated DC Plan:  [place]  CM consult      [facility] Choice  HOME HEALTH   Choice offered to / List presented to:  C-1 Patient        [place] arranged  HH-2 PT      [place]   Status of service:  Completed, signed off Medicare Important Message given?  NA - LOS <3 / Initial given by admissions (If response is "NO", the following Medicare IM given date fields will be blank) Date Medicare IM given:   Date Additional Medicare IM given:    Discharge Disposition:  [place]  Per UR Regulation:    If discussed at Long Length of Stay Meetings, dates discussed:    Comments:

## 2012-08-13 NOTE — Progress Notes (Signed)
Received orders for rw and commode.  Patients states that she already has these at home.  No DME needs at this time.

## 2012-08-13 NOTE — Progress Notes (Signed)
Nutrition Brief Note  Patient identified on the Malnutrition Screening Tool (MST) Report  Body mass index is 26.46 kg/(m^2). Patient meets criteria for Overweight based on current BMI. Pt reports that she recently lost 5 lbs but, denies any decreased appetite or concerns regarding wt loss. Encouraged adequate nutrition and protein intake during recovery period. Pt has no questions or concerns at this time.  Current diet order is Low sodium, heart healthy , patient is consuming approximately 100% of meals at this time. Labs and medications reviewed.   No nutrition interventions warranted at this time. If nutrition issues arise, please consult RD.   Pryor Ochoa RD, LDN Inpatient Clinical Dietitian Pager: [phone] After Hours Pager: [phone]

## 2012-08-13 NOTE — Progress Notes (Signed)
   Subjective: 1 Day Post-Op Procedure(s) (LRB): TOTAL KNEE ARTHROPLASTY (Left)   Patient reports pain as mild, pain well controlled. No events throughout the night. Ready to be discharged home.  Objective:   VITALS:   Filed Vitals:   08/13/12 0800  BP: 102/60  Pulse: 66  Temp: 97.7 F (36.5 C)   Resp: 14    Neurovascular intact Dorsiflexion/Plantar flexion intact Incision: dressing C/D/I No cellulitis present Compartment soft  LABS  Recent Labs  08/13/12 0410  HGB 13.0  HCT 37.0  WBC 13.0*  PLT 217     Recent Labs  08/13/12 0410  NA 133*  K 3.8  BUN 13  CREATININE 1.01  GLUCOSE 115*     Assessment/Plan: 1 Day Post-Op Procedure(s) (LRB): TOTAL KNEE ARTHROPLASTY (Left) HV drain d/c'ed Foley cath d/c'ed Advance diet Up with therapy D/C IV fluids Discharge home with home health Follow up in 2 weeks at [facility]. Follow up with OLIN,Randle Shatzer D in 2 weeks.  Contact information:  [facility] [address] [place] [id number]   Overweight (BMI 25-29.9) Estimated body mass index is 26.46 kg/(m^2) as calculated from the following:   Height as of this encounter: 5\' 7"  (1.702 m).   Weight as of this encounter: 76.658 kg (169 lb). Patient also counseled that weight may inhibit the healing process Patient counseled that losing weight will help with future health issues  Hyponatremia Treated with IV fluids and will observe      West Pugh. Kalene Cutler   PAC  08/13/2012, 9:43 AM

## 2012-08-13 NOTE — Evaluation (Signed)
Physical Therapy Evaluation Patient Details Name: Jill Fletcher MRN: [id number] DOB: 10-Mar-1941 Today's Date: 08/13/2012 Time: 0950-1017 PT Time Calculation (min): 27 min  PT Assessment / Plan / Recommendation Clinical Impression  pt s/p left TKA and desires to go home today; will benefit form HHPT at D/C    PT Assessment  Patient needs continued PT services;All further PT needs can be met in the next venue of care    Follow Up Recommendations  Home health PT    Does the patient have the potential to tolerate intense rehabilitation      Barriers to Discharge        Equipment Recommendations  None recommended by PT    Recommendations for Other Services     Frequency 7X/week    Precautions / Restrictions Precautions Precautions: Knee Restrictions Weight Bearing Restrictions: Yes LLE Weight Bearing: Weight bearing as tolerated   Pertinent Vitals/Pain No c/o pain      Mobility  Bed Mobility Bed Mobility: Supine to Sit Supine to Sit: 5: Supervision Details for Bed Mobility Assistance: cues for self assist Transfers Transfers: Sit to Stand;Stand to Sit Sit to Stand: 4: Min guard;5: Supervision Stand to Sit: 4: Min guard;5: Supervision Details for Transfer Assistance: cues for hand placement and safety Ambulation/Gait Ambulation/Gait Assistance: 4: Min guard;5: Supervision Ambulation Distance (Feet): 150 Feet Assistive device: Rolling walker Ambulation/Gait Assistance Details: cues for sequence and RW safety Gait Pattern: Step-to pattern;Step-through pattern    Exercises Total Joint Exercises Ankle Circles/Pumps: AROM;15 reps;Both Quad Sets: AROM;Both;10 reps Heel Slides: AROM;AAROM;Left;10 reps Hip ABduction/ADduction: AROM;Left;10 reps Straight Leg Raises: AROM;Left;10 reps   PT Diagnosis: Difficulty walking  PT Problem List: Decreased strength;Decreased range of motion;Decreased mobility;Decreased knowledge of use of DME PT Treatment Interventions: DME  instruction;Gait training;Functional mobility training;Therapeutic exercise   PT Goals Acute Rehab PT Goals PT Goal Formulation: With patient Pt will Ambulate: 51 - 150 feet;with modified independence;with rolling walker PT Goal: Ambulate - Progress: Goal set today Pt will Perform Home Exercise Program: with supervision, verbal cues required/provided PT Goal: Perform Home Exercise Program - Progress: Goal set today  Visit Information  Last PT Received On: 08/13/12 Assistance Needed: +1    Subjective Data  Subjective: I feel good Patient Stated Goal: home    Prior Functioning  Home Living Lives With: Spouse Available Help at Discharge: Family;Available PRN/intermittently Type of Home: House Home Access: Stairs to enter [employer] of Steps: 1 Home Layout: One level Bathroom Toilet: Handicapped height Home Adaptive Equipment: Walker - rolling Prior Function Level of Independence: Independent Able to Take Stairs?: Yes Communication Communication: No difficulties    Cognition  Cognition Overall Cognitive Status: Appears within functional limits for tasks assessed/performed Arousal/Alertness: Awake/alert Orientation Level: Appears intact for tasks assessed Behavior During Session: [facility] for tasks performed    Extremity/Trunk Assessment Right Lower Extremity Assessment RLE ROM/Strength/Tone: [facility] for tasks assessed Left Lower Extremity Assessment LLE ROM/Strength/Tone: Deficits LLE ROM/Strength/Tone Deficits: able to do I SLR; ankle WFL; knee AAROM grossly ~8 degrees to ~90degrees   Balance Dynamic Standing Balance Dynamic Standing - Balance Support: During functional activity Dynamic Standing - Level of Assistance: 5: Stand by assistance  End of Session PT - End of Session Activity Tolerance: Patient tolerated treatment well Patient left: in chair;with call bell/phone within reach Nurse Communication: Mobility status  GP     [facility] 08/13/2012, 10:23  AM

## 2012-08-13 NOTE — Evaluation (Signed)
Occupational Therapy Evaluation Patient Details Name: Jill Fletcher MRN: [id number] DOB: April 06, 1941 Today's Date: 08/13/2012 Time: [id number] OT Time Calculation (min): 14 min  OT Assessment / Plan / Recommendation Clinical Impression  Pt admitted for L TKA.  Plans to d/c home with husband.  All education completed.  No equipment needs.    OT Assessment  Patient does not need any further OT services    Follow Up Recommendations  Supervision - Intermittent    Barriers to Discharge      Equipment Recommendations  None recommended by OT    Recommendations for Other Services    Frequency       Precautions / Restrictions Precautions Precautions: Knee Restrictions Weight Bearing Restrictions: Yes LLE Weight Bearing: Weight bearing as tolerated   Pertinent Vitals/Pain No c/o pain.    ADL  ADL Comments: Educated pt on availability and use of AE for LB ADL.  Pt has a long handled netted sponge and a reacher.  Plans to wear velcro shoes.  Will rely on husband for supervision with shower (educated verbally on technique) and donning socks.  Pt has all necessary DME at home.    OT Diagnosis:    OT Problem List:   OT Treatment Interventions:     OT Goals    Visit Information  Last OT Received On: 08/13/12 Assistance Needed: +1    Subjective Data  Subjective: "My husband can help me." Patient Stated Goal: Walk without pain.   Prior Functioning     Home Living Lives With: Spouse Available Help at Discharge: Family;Available PRN/intermittently Type of Home: House Home Access: Stairs to enter [employer] of Steps: 1 Home Layout: One level Bathroom Shower/Tub: Multimedia programmer: Handicapped height Bathroom Accessibility: Yes How Accessible: Accessible via walker [place]: Walker - rolling;Grab bars in shower;Hand-held shower hose;Built-in shower seat;Reacher Prior Function Level of Independence: Independent Able to Take Stairs?:  Yes Driving: Yes Communication Communication: No difficulties Dominant Hand: Right         Vision/Perception Vision - History Baseline Vision: Wears glasses all the time   Cognition  Cognition Overall Cognitive Status: Appears within functional limits for tasks assessed/performed Arousal/Alertness: Awake/alert Orientation Level: Appears intact for tasks assessed Behavior During Session: [facility] for tasks performed    Extremity/Trunk Assessment Right Upper Extremity Assessment RUE ROM/Strength/Tone: WFL for tasks assessed RUE Coordination: WFL - gross/fine motor Left Upper Extremity Assessment LUE ROM/Strength/Tone: WFL for tasks assessed LUE Coordination: WFL - gross/fine motor     Mobility Bed Mobility Bed Mobility: Not assessed Transfers Sit to Stand: 4: Min guard;5: Supervision Stand to Sit: 4: Min guard;5: Supervision Details for Transfer Assistance: cues for hand placement and safety     Exercise   Balance    End of Session OT - End of Session Activity Tolerance: Patient tolerated treatment well Patient left: in chair;with call bell/phone within reach  GO     Malka So 08/13/2012, 11:06 AM [phone]

## 2012-08-14 DIAGNOSIS — Z471 Aftercare following joint replacement surgery: Secondary | ICD-10-CM | POA: Diagnosis not present

## 2012-08-14 DIAGNOSIS — Z96659 Presence of unspecified artificial knee joint: Secondary | ICD-10-CM | POA: Diagnosis not present

## 2012-08-14 DIAGNOSIS — Z4789 Encounter for other orthopedic aftercare: Secondary | ICD-10-CM | POA: Diagnosis not present

## 2012-08-14 DIAGNOSIS — I10 Essential (primary) hypertension: Secondary | ICD-10-CM | POA: Diagnosis not present

## 2012-08-15 DIAGNOSIS — I10 Essential (primary) hypertension: Secondary | ICD-10-CM | POA: Diagnosis not present

## 2012-08-15 DIAGNOSIS — Z96659 Presence of unspecified artificial knee joint: Secondary | ICD-10-CM | POA: Diagnosis not present

## 2012-08-15 DIAGNOSIS — Z471 Aftercare following joint replacement surgery: Secondary | ICD-10-CM | POA: Diagnosis not present

## 2012-08-15 NOTE — Discharge Summary (Signed)
Physician Discharge Summary  Patient ID: Jill Fletcher MRN: [id number] DOB/AGE: 1940-12-17 72 y.o.  Admit date: 08/12/2012 Discharge date: 08/13/2012   Procedures:  Procedure(s) (LRB): TOTAL KNEE ARTHROPLASTY (Left)  Attending Physician:  Dr. Paralee Cancel   Admission Diagnoses:   Left knee OA / pain  Discharge Diagnoses:  Principal Problem:   S/P left TKA Active Problems:   Overweight (BMI 25.0-29.9)   Hyponatremia  Past Medical History  Diagnosis Date  . Hypertension   . Hypercholesteremia   . Primary biliary cirrhosis   . Hypothyroidism   . Osteoarthritis of spine   . Fibromyalgia   . GERD (gastroesophageal reflux disease)   . PONV (postoperative nausea and vomiting)     yrs ago, 08-03-2004 back xray on chart per dr Alvan Dame request    HPI:    Jill Fletcher, 72 y.o. female, has a history of pain and functional disability in the left knee due to arthritis and has failed non-surgical conservative treatments for greater than 12 weeks to includeNSAID's and/or analgesics and activity modification. Onset of symptoms was gradual, starting 3 years ago with gradually worsening course since that time. The patient noted no past surgery on the left knee(s). Patient currently rates pain in the left knee(s) at 8 out of 10 with activity. Patient has night pain, worsening of pain with activity and weight bearing, pain that interferes with activities of daily living, pain with passive range of motion, crepitus and joint swelling. Patient has evidence of periarticular osteophytes and joint space narrowing by imaging studies. There is no active infection. Risks, benefits and expectations were discussed with the patient. Patient understand the risks, benefits and expectations and wishes to proceed with surgery.   PCP: Ernestene Kiel, MD   Discharged Condition: good  Hospital Course:  Patient underwent the above stated procedure on 08/12/2012. Patient tolerated the procedure well and brought to  the recovery room in good condition and subsequently to the floor.  POD #1 BP: 102/60 ; Pulse: 66 ; Temp: 97.7 F (36.5 C) ; Resp: 14  Pt's foley was removed, as well as the hemovac drain removed. IV was changed to a saline lock. Patient reports pain as mild, pain well controlled. No events throughout the night. Ready to be discharged home. Neurovascular intact, dorsiflexion/plantar flexion intact, incision: dressing C/D/I, no cellulitis present and compartment soft.   LABS  Basename  08/13/12    0410   HGB  13.0  HCT  37.0    Discharge Exam: General appearance: alert, cooperative and no distress Extremities: Homans sign is negative, no sign of DVT, no edema, redness or tenderness in the calves or thighs and no ulcers, gangrene or trophic changes  Disposition:     Home-Health Care Svc with follow up in 2 weeks   Follow-up Information   Follow up with Mauri Pole, MD. Schedule an appointment as soon as possible for a visit in 2 weeks.   Contact information:   [address] [place] [address] [id number]       Discharge Orders   Future Orders Complete By Expires     Call MD / Call 911  As directed     Comments:      If you experience chest pain or shortness of breath, CALL 911 and be transported to the hospital emergency room.  If you develope a fever above 101 F, pus (white drainage) or increased drainage or redness at the wound, or calf pain, call your surgeon's office.  Change dressing  As directed     Comments:      Maintain surgical dressing for 10-14 days, then change the dressing daily with sterile 4 x 4 inch gauze dressing and tape. Keep the area dry and clean.    Constipation Prevention  As directed     Comments:      Drink plenty of fluids.  Prune juice may be helpful.  You may use a stool softener, such as Colace (over the counter) 100 mg twice a day.  Use MiraLax (over the counter) for constipation as needed.    Diet - low sodium heart healthy   As directed     Discharge instructions  As directed     Comments:      Maintain surgical dressing for 10-14 days, then replace with gauze and tape. Keep the area dry and clean until follow up. Follow up in 2 weeks at [facility]. Call with any questions or concerns.    Increase activity slowly as tolerated  As directed     TED hose  As directed     Comments:      Use stockings (TED hose) for 2 weeks on both leg(s).  You may remove them at night for sleeping.    Weight bearing as tolerated  As directed          Medication List    STOP taking these medications       aspirin 81 MG tablet      TAKE these medications       aspirin EC 325 MG tablet  Take 1 tablet (325 mg total) by mouth 2 (two) times daily.     B-COMPLEX PO  Take 1 capsule by mouth daily.     CALCIUM + D PO  Take 1 tablet by mouth daily.     Coenzyme Q10 200 MG capsule  Take 200 mg by mouth daily.     DSS 100 MG Caps  Take 100 mg by mouth 2 (two) times daily.     estradiol 0.05 MG/24HR  Commonly known as:  VIVELLE-DOT  Place 1 patch onto the skin once a week.     ferrous sulfate 325 (65 FE) MG tablet  Take 1 tablet (325 mg total) by mouth 3 (three) times daily after meals.     FIBER PO  Take 2 tablets by mouth 2 (two) times daily.     fish oil-omega-3 fatty acids 1000 MG capsule  Take 1 g by mouth daily.     levothyroxine 75 MCG tablet  Commonly known as:  SYNTHROID, LEVOTHROID  Take 75 mcg by mouth daily before breakfast.     Magnesium 250 MG Tabs  Take 250 mg by mouth daily.     metoprolol succinate 50 MG 24 hr tablet  Commonly known as:  TOPROL-XL  Take 50 mg by mouth daily before breakfast. Take with or immediately following a meal.     Milk Thistle 1000 MG Caps  Take 1 capsule by mouth daily.     MULTIVITAMIN PO  Take 1 tablet by mouth daily.     oxyCODONE 5 MG immediate release tablet  Commonly known as:  Oxy IR/ROXICODONE  Take 1-2 tablets (5-10 mg total) by mouth  every 4 (four) hours as needed for pain.     pantoprazole 40 MG tablet  Commonly known as:  PROTONIX  Take 40 mg by mouth every morning.     polyethylene glycol packet  Commonly known as:  MIRALAX /  GLYCOLAX  Take 17 g by mouth 2 (two) times daily.     SM CRANBERRY 300 MG tablet  Generic drug:  Cranberry  Take 300 mg by mouth daily.     tiZANidine 4 MG capsule  Commonly known as:  ZANAFLEX  Take 1 capsule (4 mg total) by mouth 3 (three) times daily. Muscle spasms     ursodiol 500 MG tablet  Commonly known as:  ACTIGALL  Take 500 mg by mouth 2 (two) times daily.     vitamin C 500 MG tablet  Commonly known as:  ASCORBIC ACID  Take 500 mg by mouth daily.     Vitamin D3 1000 UNITS Caps  Take 1,000 Units by mouth daily.         Signed: West Pugh. Jemma Rasp   PAC  08/15/2012, 4:27 PM

## 2012-08-18 DIAGNOSIS — Z471 Aftercare following joint replacement surgery: Secondary | ICD-10-CM | POA: Diagnosis not present

## 2012-08-18 DIAGNOSIS — I10 Essential (primary) hypertension: Secondary | ICD-10-CM | POA: Diagnosis not present

## 2012-08-18 DIAGNOSIS — Z96659 Presence of unspecified artificial knee joint: Secondary | ICD-10-CM | POA: Diagnosis not present

## 2012-08-19 DIAGNOSIS — Z96659 Presence of unspecified artificial knee joint: Secondary | ICD-10-CM | POA: Diagnosis not present

## 2012-08-19 DIAGNOSIS — Z471 Aftercare following joint replacement surgery: Secondary | ICD-10-CM | POA: Diagnosis not present

## 2012-08-19 DIAGNOSIS — I10 Essential (primary) hypertension: Secondary | ICD-10-CM | POA: Diagnosis not present

## 2012-08-20 DIAGNOSIS — Z96659 Presence of unspecified artificial knee joint: Secondary | ICD-10-CM | POA: Diagnosis not present

## 2012-08-20 DIAGNOSIS — Z471 Aftercare following joint replacement surgery: Secondary | ICD-10-CM | POA: Diagnosis not present

## 2012-08-20 DIAGNOSIS — I10 Essential (primary) hypertension: Secondary | ICD-10-CM | POA: Diagnosis not present

## 2012-08-21 DIAGNOSIS — Z96659 Presence of unspecified artificial knee joint: Secondary | ICD-10-CM | POA: Diagnosis not present

## 2012-08-21 DIAGNOSIS — I10 Essential (primary) hypertension: Secondary | ICD-10-CM | POA: Diagnosis not present

## 2012-08-21 DIAGNOSIS — Z471 Aftercare following joint replacement surgery: Secondary | ICD-10-CM | POA: Diagnosis not present

## 2012-08-22 DIAGNOSIS — Z96659 Presence of unspecified artificial knee joint: Secondary | ICD-10-CM | POA: Diagnosis not present

## 2012-08-22 DIAGNOSIS — Z471 Aftercare following joint replacement surgery: Secondary | ICD-10-CM | POA: Diagnosis not present

## 2012-08-22 DIAGNOSIS — I10 Essential (primary) hypertension: Secondary | ICD-10-CM | POA: Diagnosis not present

## 2012-08-25 ENCOUNTER — Ambulatory Visit (HOSPITAL_COMMUNITY)
Admission: RE | Admit: 2012-08-25 | Discharge: 2012-08-25 | Disposition: A | Discharge status: Home / Self Care | Payer: Medicare Other | Source: Ambulatory Visit | Attending: Orthopedic Surgery | Admitting: Orthopedic Surgery

## 2012-08-25 ENCOUNTER — Other Ambulatory Visit (HOSPITAL_COMMUNITY): Payer: Self-pay | Admitting: Orthopedic Surgery

## 2012-08-25 DIAGNOSIS — R609 Edema, unspecified: Secondary | ICD-10-CM

## 2012-08-25 DIAGNOSIS — I82402 Acute embolism and thrombosis of unspecified deep veins of left lower extremity: Secondary | ICD-10-CM

## 2012-08-25 DIAGNOSIS — I82409 Acute embolism and thrombosis of unspecified deep veins of unspecified lower extremity: Secondary | ICD-10-CM | POA: Insufficient documentation

## 2012-08-25 DIAGNOSIS — M79609 Pain in unspecified limb: Secondary | ICD-10-CM | POA: Diagnosis not present

## 2012-08-25 DIAGNOSIS — M7989 Other specified soft tissue disorders: Secondary | ICD-10-CM | POA: Diagnosis not present

## 2012-08-25 NOTE — Progress Notes (Signed)
Left lower extremity venous ultrasound was completed. Aris [place] RVT

## 2012-08-26 DIAGNOSIS — I10 Essential (primary) hypertension: Secondary | ICD-10-CM | POA: Diagnosis not present

## 2012-08-26 DIAGNOSIS — Z96659 Presence of unspecified artificial knee joint: Secondary | ICD-10-CM | POA: Diagnosis not present

## 2012-08-26 DIAGNOSIS — Z471 Aftercare following joint replacement surgery: Secondary | ICD-10-CM | POA: Diagnosis not present

## 2012-08-27 DIAGNOSIS — I10 Essential (primary) hypertension: Secondary | ICD-10-CM | POA: Diagnosis not present

## 2012-08-27 DIAGNOSIS — Z96659 Presence of unspecified artificial knee joint: Secondary | ICD-10-CM | POA: Diagnosis not present

## 2012-08-27 DIAGNOSIS — Z471 Aftercare following joint replacement surgery: Secondary | ICD-10-CM | POA: Diagnosis not present

## 2012-08-28 DIAGNOSIS — Z96659 Presence of unspecified artificial knee joint: Secondary | ICD-10-CM | POA: Diagnosis not present

## 2012-08-28 DIAGNOSIS — Z471 Aftercare following joint replacement surgery: Secondary | ICD-10-CM | POA: Diagnosis not present

## 2012-08-28 DIAGNOSIS — I10 Essential (primary) hypertension: Secondary | ICD-10-CM | POA: Diagnosis not present

## 2012-08-29 DIAGNOSIS — N3000 Acute cystitis without hematuria: Secondary | ICD-10-CM | POA: Diagnosis not present

## 2012-09-01 DIAGNOSIS — I10 Essential (primary) hypertension: Secondary | ICD-10-CM | POA: Diagnosis not present

## 2012-09-01 DIAGNOSIS — Z471 Aftercare following joint replacement surgery: Secondary | ICD-10-CM | POA: Diagnosis not present

## 2012-09-01 DIAGNOSIS — Z96659 Presence of unspecified artificial knee joint: Secondary | ICD-10-CM | POA: Diagnosis not present

## 2012-09-02 DIAGNOSIS — M1710 Unilateral primary osteoarthritis, unspecified knee: Secondary | ICD-10-CM | POA: Diagnosis not present

## 2012-09-02 DIAGNOSIS — R262 Difficulty in walking, not elsewhere classified: Secondary | ICD-10-CM | POA: Diagnosis not present

## 2012-09-04 DIAGNOSIS — R262 Difficulty in walking, not elsewhere classified: Secondary | ICD-10-CM | POA: Diagnosis not present

## 2012-09-04 DIAGNOSIS — M1710 Unilateral primary osteoarthritis, unspecified knee: Secondary | ICD-10-CM | POA: Diagnosis not present

## 2012-09-08 DIAGNOSIS — M1710 Unilateral primary osteoarthritis, unspecified knee: Secondary | ICD-10-CM | POA: Diagnosis not present

## 2012-09-08 DIAGNOSIS — R262 Difficulty in walking, not elsewhere classified: Secondary | ICD-10-CM | POA: Diagnosis not present

## 2012-09-09 DIAGNOSIS — I10 Essential (primary) hypertension: Secondary | ICD-10-CM | POA: Diagnosis not present

## 2012-09-09 DIAGNOSIS — N390 Urinary tract infection, site not specified: Secondary | ICD-10-CM | POA: Diagnosis not present

## 2012-09-09 DIAGNOSIS — R109 Unspecified abdominal pain: Secondary | ICD-10-CM | POA: Diagnosis not present

## 2012-09-09 DIAGNOSIS — K219 Gastro-esophageal reflux disease without esophagitis: Secondary | ICD-10-CM | POA: Diagnosis not present

## 2012-09-09 DIAGNOSIS — K5900 Constipation, unspecified: Secondary | ICD-10-CM | POA: Diagnosis not present

## 2012-09-09 DIAGNOSIS — R110 Nausea: Secondary | ICD-10-CM | POA: Diagnosis not present

## 2012-09-10 DIAGNOSIS — M1710 Unilateral primary osteoarthritis, unspecified knee: Secondary | ICD-10-CM | POA: Diagnosis not present

## 2012-09-10 DIAGNOSIS — R262 Difficulty in walking, not elsewhere classified: Secondary | ICD-10-CM | POA: Diagnosis not present

## 2012-09-15 DIAGNOSIS — R262 Difficulty in walking, not elsewhere classified: Secondary | ICD-10-CM | POA: Diagnosis not present

## 2012-09-15 DIAGNOSIS — IMO0001 Reserved for inherently not codable concepts without codable children: Secondary | ICD-10-CM | POA: Diagnosis not present

## 2012-09-15 DIAGNOSIS — M1710 Unilateral primary osteoarthritis, unspecified knee: Secondary | ICD-10-CM | POA: Diagnosis not present

## 2012-09-17 DIAGNOSIS — M1710 Unilateral primary osteoarthritis, unspecified knee: Secondary | ICD-10-CM | POA: Diagnosis not present

## 2012-09-17 DIAGNOSIS — R262 Difficulty in walking, not elsewhere classified: Secondary | ICD-10-CM | POA: Diagnosis not present

## 2012-09-22 DIAGNOSIS — K219 Gastro-esophageal reflux disease without esophagitis: Secondary | ICD-10-CM | POA: Diagnosis not present

## 2012-09-23 DIAGNOSIS — Z79899 Other long term (current) drug therapy: Secondary | ICD-10-CM | POA: Diagnosis not present

## 2012-09-23 DIAGNOSIS — M1710 Unilateral primary osteoarthritis, unspecified knee: Secondary | ICD-10-CM | POA: Diagnosis not present

## 2012-09-23 DIAGNOSIS — K229 Disease of esophagus, unspecified: Secondary | ICD-10-CM | POA: Diagnosis not present

## 2012-09-23 DIAGNOSIS — R110 Nausea: Secondary | ICD-10-CM | POA: Diagnosis not present

## 2012-09-23 DIAGNOSIS — R262 Difficulty in walking, not elsewhere classified: Secondary | ICD-10-CM | POA: Diagnosis not present

## 2012-09-23 DIAGNOSIS — I10 Essential (primary) hypertension: Secondary | ICD-10-CM | POA: Diagnosis not present

## 2012-09-25 DIAGNOSIS — M1710 Unilateral primary osteoarthritis, unspecified knee: Secondary | ICD-10-CM | POA: Diagnosis not present

## 2012-09-25 DIAGNOSIS — R262 Difficulty in walking, not elsewhere classified: Secondary | ICD-10-CM | POA: Diagnosis not present

## 2012-09-26 DIAGNOSIS — K745 Biliary cirrhosis, unspecified: Secondary | ICD-10-CM | POA: Diagnosis not present

## 2012-09-30 DIAGNOSIS — Z79899 Other long term (current) drug therapy: Secondary | ICD-10-CM | POA: Diagnosis not present

## 2012-10-23 DIAGNOSIS — I10 Essential (primary) hypertension: Secondary | ICD-10-CM | POA: Diagnosis not present

## 2012-11-25 DIAGNOSIS — N390 Urinary tract infection, site not specified: Secondary | ICD-10-CM | POA: Diagnosis not present

## 2012-11-25 DIAGNOSIS — N3000 Acute cystitis without hematuria: Secondary | ICD-10-CM | POA: Diagnosis not present

## 2012-12-09 DIAGNOSIS — Z1231 Encounter for screening mammogram for malignant neoplasm of breast: Secondary | ICD-10-CM | POA: Diagnosis not present

## 2012-12-18 DIAGNOSIS — M2170 Unequal limb length (acquired), unspecified site: Secondary | ICD-10-CM | POA: Diagnosis not present

## 2012-12-18 DIAGNOSIS — R269 Unspecified abnormalities of gait and mobility: Secondary | ICD-10-CM | POA: Diagnosis not present

## 2013-01-23 DIAGNOSIS — M25549 Pain in joints of unspecified hand: Secondary | ICD-10-CM | POA: Diagnosis not present

## 2013-01-23 DIAGNOSIS — E039 Hypothyroidism, unspecified: Secondary | ICD-10-CM | POA: Diagnosis not present

## 2013-01-23 DIAGNOSIS — Z79899 Other long term (current) drug therapy: Secondary | ICD-10-CM | POA: Diagnosis not present

## 2013-01-23 DIAGNOSIS — K745 Biliary cirrhosis, unspecified: Secondary | ICD-10-CM | POA: Diagnosis not present

## 2013-01-23 DIAGNOSIS — I10 Essential (primary) hypertension: Secondary | ICD-10-CM | POA: Diagnosis not present

## 2013-02-10 DIAGNOSIS — Z23 Encounter for immunization: Secondary | ICD-10-CM | POA: Diagnosis not present

## 2013-04-11 DIAGNOSIS — N3000 Acute cystitis without hematuria: Secondary | ICD-10-CM | POA: Diagnosis not present

## 2013-05-21 DIAGNOSIS — Z79899 Other long term (current) drug therapy: Secondary | ICD-10-CM | POA: Diagnosis not present

## 2013-05-21 DIAGNOSIS — E039 Hypothyroidism, unspecified: Secondary | ICD-10-CM | POA: Diagnosis not present

## 2013-05-21 DIAGNOSIS — M19049 Primary osteoarthritis, unspecified hand: Secondary | ICD-10-CM | POA: Diagnosis not present

## 2013-05-21 DIAGNOSIS — K219 Gastro-esophageal reflux disease without esophagitis: Secondary | ICD-10-CM | POA: Diagnosis not present

## 2013-05-21 DIAGNOSIS — I10 Essential (primary) hypertension: Secondary | ICD-10-CM | POA: Diagnosis not present

## 2013-07-08 DIAGNOSIS — H04129 Dry eye syndrome of unspecified lacrimal gland: Secondary | ICD-10-CM | POA: Diagnosis not present

## 2013-09-11 DIAGNOSIS — H04129 Dry eye syndrome of unspecified lacrimal gland: Secondary | ICD-10-CM | POA: Diagnosis not present

## 2013-09-11 DIAGNOSIS — H40019 Open angle with borderline findings, low risk, unspecified eye: Secondary | ICD-10-CM | POA: Diagnosis not present

## 2013-09-11 DIAGNOSIS — H2589 Other age-related cataract: Secondary | ICD-10-CM | POA: Diagnosis not present

## 2013-09-18 DIAGNOSIS — K219 Gastro-esophageal reflux disease without esophagitis: Secondary | ICD-10-CM | POA: Diagnosis not present

## 2013-09-18 DIAGNOSIS — I10 Essential (primary) hypertension: Secondary | ICD-10-CM | POA: Diagnosis not present

## 2013-09-18 DIAGNOSIS — Z13820 Encounter for screening for osteoporosis: Secondary | ICD-10-CM | POA: Diagnosis not present

## 2013-09-18 DIAGNOSIS — E039 Hypothyroidism, unspecified: Secondary | ICD-10-CM | POA: Diagnosis not present

## 2013-09-18 DIAGNOSIS — K644 Residual hemorrhoidal skin tags: Secondary | ICD-10-CM | POA: Diagnosis not present

## 2013-09-22 DIAGNOSIS — G8929 Other chronic pain: Secondary | ICD-10-CM | POA: Diagnosis not present

## 2013-09-22 DIAGNOSIS — H259 Unspecified age-related cataract: Secondary | ICD-10-CM | POA: Diagnosis not present

## 2013-09-22 DIAGNOSIS — E039 Hypothyroidism, unspecified: Secondary | ICD-10-CM | POA: Diagnosis not present

## 2013-09-22 DIAGNOSIS — H269 Unspecified cataract: Secondary | ICD-10-CM | POA: Diagnosis not present

## 2013-09-22 DIAGNOSIS — K219 Gastro-esophageal reflux disease without esophagitis: Secondary | ICD-10-CM | POA: Diagnosis not present

## 2013-09-22 DIAGNOSIS — H2589 Other age-related cataract: Secondary | ICD-10-CM | POA: Diagnosis not present

## 2013-09-22 DIAGNOSIS — M545 Low back pain, unspecified: Secondary | ICD-10-CM | POA: Diagnosis not present

## 2013-09-22 DIAGNOSIS — I10 Essential (primary) hypertension: Secondary | ICD-10-CM | POA: Diagnosis not present

## 2013-09-29 DIAGNOSIS — H40019 Open angle with borderline findings, low risk, unspecified eye: Secondary | ICD-10-CM | POA: Diagnosis not present

## 2013-10-02 DIAGNOSIS — M899 Disorder of bone, unspecified: Secondary | ICD-10-CM | POA: Diagnosis not present

## 2013-10-02 DIAGNOSIS — Z13820 Encounter for screening for osteoporosis: Secondary | ICD-10-CM | POA: Diagnosis not present

## 2013-10-06 DIAGNOSIS — E039 Hypothyroidism, unspecified: Secondary | ICD-10-CM | POA: Diagnosis not present

## 2013-10-06 DIAGNOSIS — H259 Unspecified age-related cataract: Secondary | ICD-10-CM | POA: Diagnosis not present

## 2013-10-06 DIAGNOSIS — H2589 Other age-related cataract: Secondary | ICD-10-CM | POA: Diagnosis not present

## 2013-10-12 DIAGNOSIS — K648 Other hemorrhoids: Secondary | ICD-10-CM | POA: Diagnosis not present

## 2013-11-07 DIAGNOSIS — T148 Other injury of unspecified body region: Secondary | ICD-10-CM | POA: Diagnosis not present

## 2013-11-07 DIAGNOSIS — W57XXXA Bitten or stung by nonvenomous insect and other nonvenomous arthropods, initial encounter: Secondary | ICD-10-CM | POA: Diagnosis not present

## 2013-11-24 DIAGNOSIS — K648 Other hemorrhoids: Secondary | ICD-10-CM | POA: Diagnosis not present

## 2013-12-21 DIAGNOSIS — Z1231 Encounter for screening mammogram for malignant neoplasm of breast: Secondary | ICD-10-CM | POA: Diagnosis not present

## 2014-01-12 DIAGNOSIS — K648 Other hemorrhoids: Secondary | ICD-10-CM | POA: Diagnosis not present

## 2014-01-22 DIAGNOSIS — K219 Gastro-esophageal reflux disease without esophagitis: Secondary | ICD-10-CM | POA: Diagnosis not present

## 2014-01-22 DIAGNOSIS — E039 Hypothyroidism, unspecified: Secondary | ICD-10-CM | POA: Diagnosis not present

## 2014-01-22 DIAGNOSIS — E785 Hyperlipidemia, unspecified: Secondary | ICD-10-CM | POA: Diagnosis not present

## 2014-01-22 DIAGNOSIS — I10 Essential (primary) hypertension: Secondary | ICD-10-CM | POA: Diagnosis not present

## 2014-01-22 DIAGNOSIS — K745 Biliary cirrhosis, unspecified: Secondary | ICD-10-CM | POA: Diagnosis not present

## 2014-01-22 DIAGNOSIS — Z79899 Other long term (current) drug therapy: Secondary | ICD-10-CM | POA: Diagnosis not present

## 2014-01-22 DIAGNOSIS — M79609 Pain in unspecified limb: Secondary | ICD-10-CM | POA: Diagnosis not present

## 2014-02-05 DIAGNOSIS — J029 Acute pharyngitis, unspecified: Secondary | ICD-10-CM | POA: Diagnosis not present

## 2014-02-05 DIAGNOSIS — J019 Acute sinusitis, unspecified: Secondary | ICD-10-CM | POA: Diagnosis not present

## 2014-02-17 DIAGNOSIS — Z23 Encounter for immunization: Secondary | ICD-10-CM | POA: Diagnosis not present

## 2014-02-24 DIAGNOSIS — Z86010 Personal history of colonic polyps: Secondary | ICD-10-CM | POA: Diagnosis not present

## 2014-02-24 DIAGNOSIS — K219 Gastro-esophageal reflux disease without esophagitis: Secondary | ICD-10-CM | POA: Diagnosis not present

## 2014-02-24 DIAGNOSIS — K743 Primary biliary cirrhosis: Secondary | ICD-10-CM | POA: Diagnosis not present

## 2014-02-25 DIAGNOSIS — R935 Abnormal findings on diagnostic imaging of other abdominal regions, including retroperitoneum: Secondary | ICD-10-CM | POA: Diagnosis not present

## 2014-02-25 DIAGNOSIS — K219 Gastro-esophageal reflux disease without esophagitis: Secondary | ICD-10-CM | POA: Diagnosis not present

## 2014-02-25 DIAGNOSIS — K743 Primary biliary cirrhosis: Secondary | ICD-10-CM | POA: Diagnosis not present

## 2014-03-05 DIAGNOSIS — K743 Primary biliary cirrhosis: Secondary | ICD-10-CM | POA: Diagnosis not present

## 2014-03-05 DIAGNOSIS — R1011 Right upper quadrant pain: Secondary | ICD-10-CM | POA: Diagnosis not present

## 2014-03-09 DIAGNOSIS — Z7982 Long term (current) use of aspirin: Secondary | ICD-10-CM | POA: Diagnosis not present

## 2014-03-09 DIAGNOSIS — I10 Essential (primary) hypertension: Secondary | ICD-10-CM | POA: Diagnosis not present

## 2014-03-09 DIAGNOSIS — Z1211 Encounter for screening for malignant neoplasm of colon: Secondary | ICD-10-CM | POA: Diagnosis not present

## 2014-03-09 DIAGNOSIS — K7460 Unspecified cirrhosis of liver: Secondary | ICD-10-CM | POA: Diagnosis not present

## 2014-03-09 DIAGNOSIS — K644 Residual hemorrhoidal skin tags: Secondary | ICD-10-CM | POA: Diagnosis not present

## 2014-03-09 DIAGNOSIS — Z86010 Personal history of colonic polyps: Secondary | ICD-10-CM | POA: Diagnosis not present

## 2014-03-09 DIAGNOSIS — K30 Functional dyspepsia: Secondary | ICD-10-CM | POA: Diagnosis not present

## 2014-03-09 DIAGNOSIS — K449 Diaphragmatic hernia without obstruction or gangrene: Secondary | ICD-10-CM | POA: Diagnosis not present

## 2014-03-09 DIAGNOSIS — Z79899 Other long term (current) drug therapy: Secondary | ICD-10-CM | POA: Diagnosis not present

## 2014-03-09 HISTORY — PX: ESOPHAGOGASTRODUODENOSCOPY: SHX1529

## 2014-03-09 HISTORY — PX: COLONOSCOPY: SHX5424

## 2014-04-14 IMAGING — CR DG CHEST 2V
2 series · 2 of 2 positions shown · non-contrast
Comparison: 12/04/2007

CLINICAL DATA: Preop total knee arthroplasty

CHEST - 2 VIEW

[w chest pa]
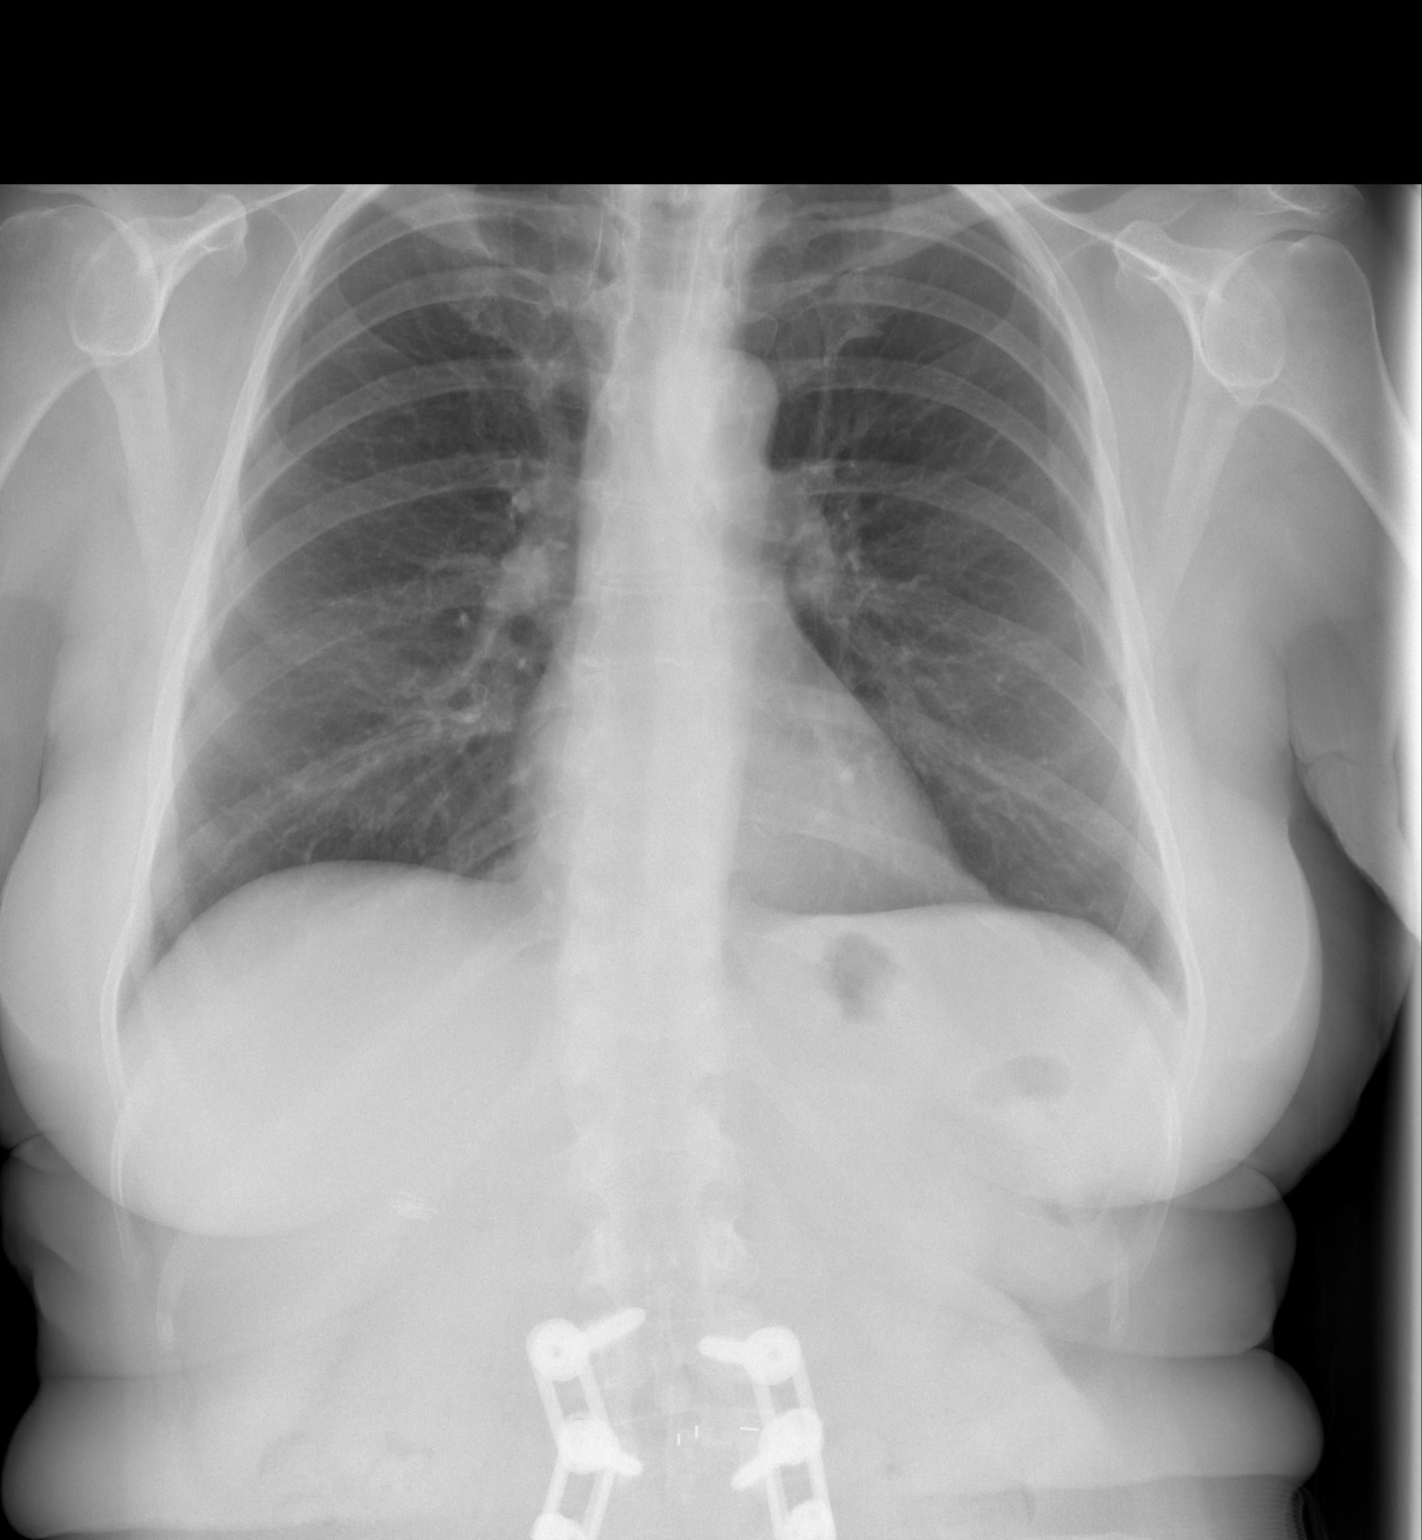

[w chest lat]
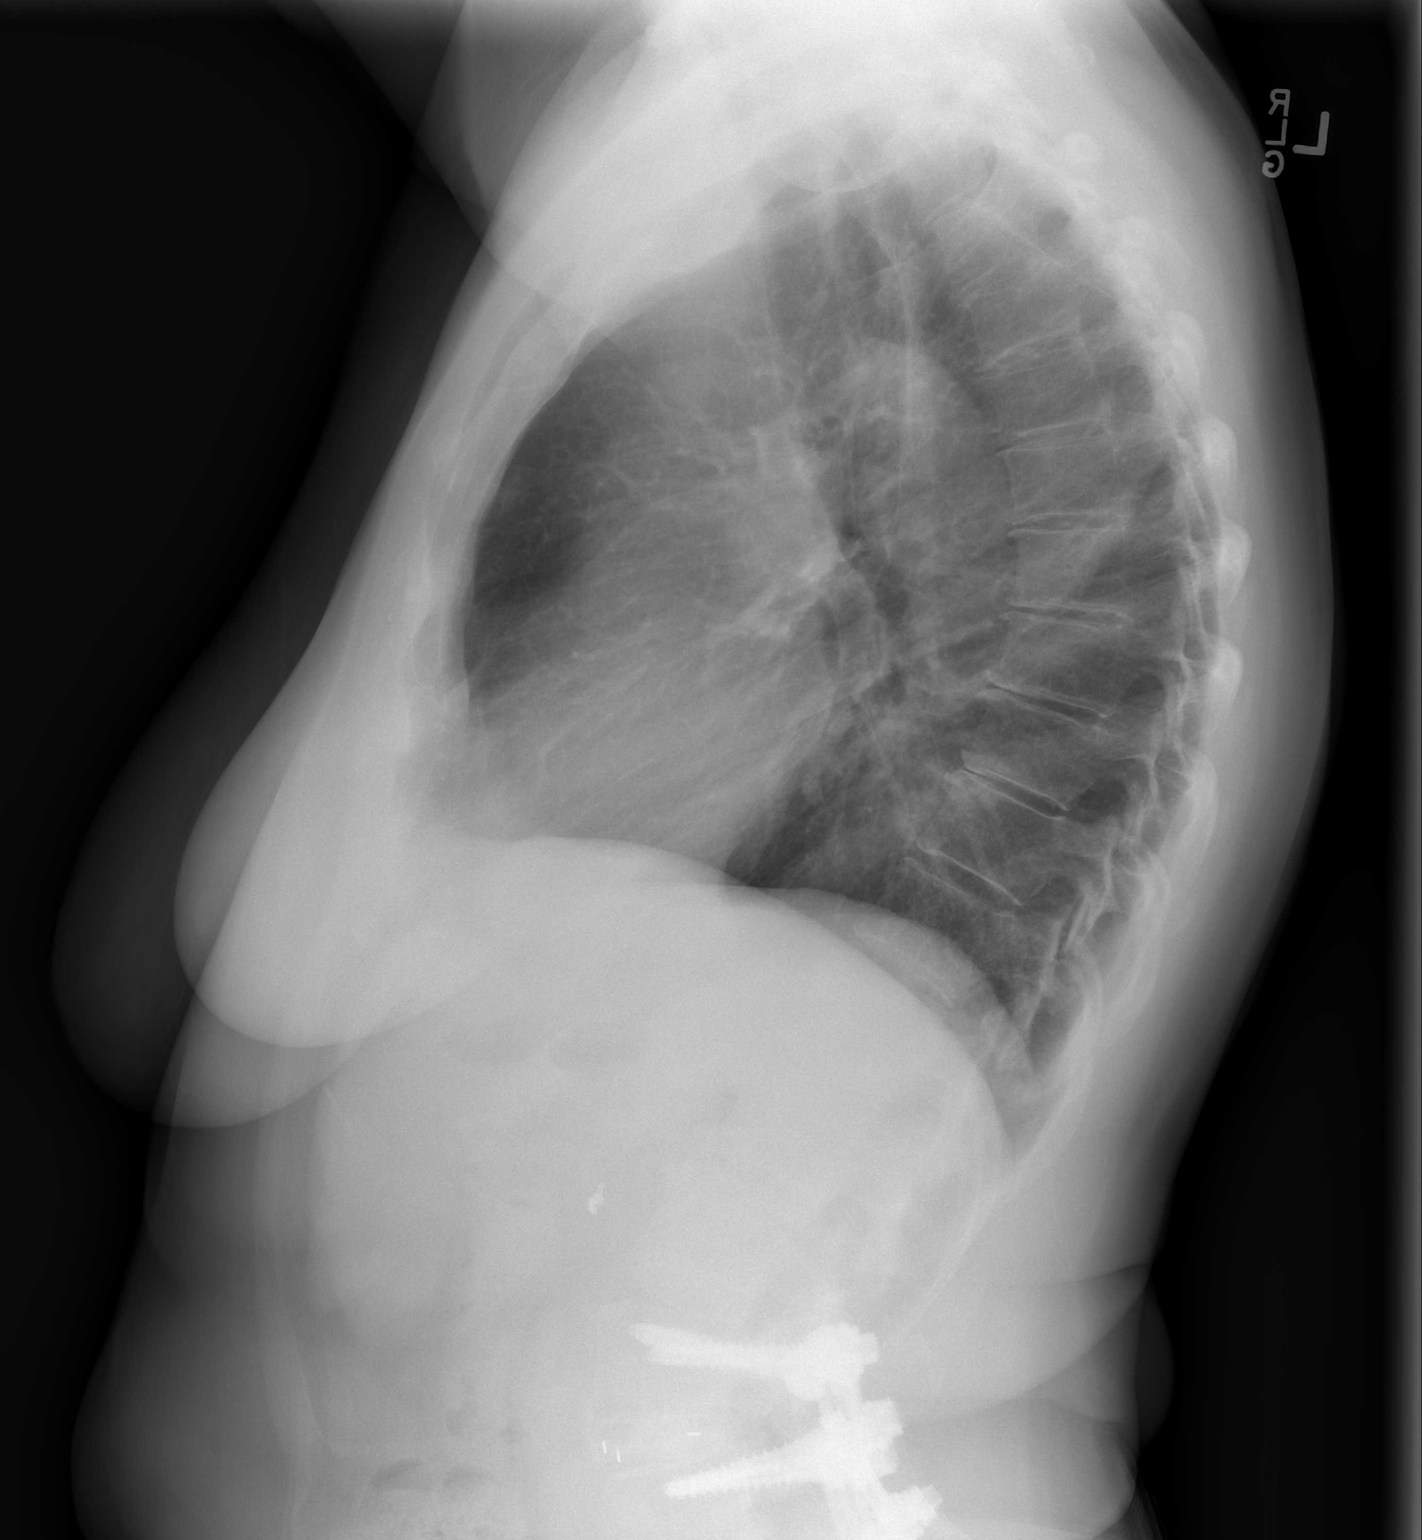

[2 of 2 positions shown; findings below may reference images not displayed]

FINDINGS: Normal heart size.  No effusion or edema.  No airspace
consolidation identified.  Cholecystectomy clips noted in the right
upper quadrant of the abdomen.  The patient is status post
posterior fusion of the lower lumbar spine.
IMPRESSION: 1.  No acute cardiopulmonary abnormalities.

## 2014-05-04 DIAGNOSIS — M779 Enthesopathy, unspecified: Secondary | ICD-10-CM | POA: Diagnosis not present

## 2014-06-08 DIAGNOSIS — M779 Enthesopathy, unspecified: Secondary | ICD-10-CM | POA: Diagnosis not present

## 2014-06-08 DIAGNOSIS — M722 Plantar fascial fibromatosis: Secondary | ICD-10-CM | POA: Diagnosis not present

## 2014-06-09 DIAGNOSIS — H26493 Other secondary cataract, bilateral: Secondary | ICD-10-CM | POA: Diagnosis not present

## 2014-07-23 DIAGNOSIS — Z79899 Other long term (current) drug therapy: Secondary | ICD-10-CM | POA: Diagnosis not present

## 2014-07-23 DIAGNOSIS — I10 Essential (primary) hypertension: Secondary | ICD-10-CM | POA: Diagnosis not present

## 2014-07-23 DIAGNOSIS — M79676 Pain in unspecified toe(s): Secondary | ICD-10-CM | POA: Diagnosis not present

## 2014-08-04 DIAGNOSIS — H26491 Other secondary cataract, right eye: Secondary | ICD-10-CM | POA: Diagnosis not present

## 2014-09-01 DIAGNOSIS — Z01411 Encounter for gynecological examination (general) (routine) with abnormal findings: Secondary | ICD-10-CM | POA: Diagnosis not present

## 2014-09-01 DIAGNOSIS — N8111 Cystocele, midline: Secondary | ICD-10-CM | POA: Diagnosis not present

## 2014-09-13 DIAGNOSIS — N390 Urinary tract infection, site not specified: Secondary | ICD-10-CM | POA: Diagnosis not present

## 2014-12-03 DIAGNOSIS — N3001 Acute cystitis with hematuria: Secondary | ICD-10-CM | POA: Diagnosis not present

## 2014-12-03 DIAGNOSIS — N3091 Cystitis, unspecified with hematuria: Secondary | ICD-10-CM | POA: Diagnosis not present

## 2014-12-21 DIAGNOSIS — R5383 Other fatigue: Secondary | ICD-10-CM | POA: Diagnosis not present

## 2014-12-21 DIAGNOSIS — M791 Myalgia: Secondary | ICD-10-CM | POA: Diagnosis not present

## 2014-12-21 DIAGNOSIS — M549 Dorsalgia, unspecified: Secondary | ICD-10-CM | POA: Diagnosis not present

## 2014-12-21 DIAGNOSIS — R229 Localized swelling, mass and lump, unspecified: Secondary | ICD-10-CM | POA: Diagnosis not present

## 2014-12-21 DIAGNOSIS — M542 Cervicalgia: Secondary | ICD-10-CM | POA: Diagnosis not present

## 2014-12-24 DIAGNOSIS — Z1231 Encounter for screening mammogram for malignant neoplasm of breast: Secondary | ICD-10-CM | POA: Diagnosis not present

## 2014-12-28 DIAGNOSIS — M4317 Spondylolisthesis, lumbosacral region: Secondary | ICD-10-CM | POA: Diagnosis not present

## 2014-12-28 DIAGNOSIS — M25551 Pain in right hip: Secondary | ICD-10-CM | POA: Diagnosis not present

## 2014-12-28 DIAGNOSIS — M545 Low back pain: Secondary | ICD-10-CM | POA: Diagnosis not present

## 2014-12-28 DIAGNOSIS — M47816 Spondylosis without myelopathy or radiculopathy, lumbar region: Secondary | ICD-10-CM | POA: Diagnosis not present

## 2014-12-28 DIAGNOSIS — E041 Nontoxic single thyroid nodule: Secondary | ICD-10-CM | POA: Diagnosis not present

## 2014-12-28 DIAGNOSIS — R221 Localized swelling, mass and lump, neck: Secondary | ICD-10-CM | POA: Diagnosis not present

## 2015-01-07 DIAGNOSIS — N8110 Cystocele, unspecified: Secondary | ICD-10-CM | POA: Diagnosis not present

## 2015-01-07 DIAGNOSIS — R221 Localized swelling, mass and lump, neck: Secondary | ICD-10-CM | POA: Diagnosis not present

## 2015-01-07 DIAGNOSIS — N3020 Other chronic cystitis without hematuria: Secondary | ICD-10-CM | POA: Diagnosis not present

## 2015-01-07 DIAGNOSIS — N3941 Urge incontinence: Secondary | ICD-10-CM | POA: Diagnosis not present

## 2015-01-07 DIAGNOSIS — R351 Nocturia: Secondary | ICD-10-CM | POA: Diagnosis not present

## 2015-01-10 DIAGNOSIS — R300 Dysuria: Secondary | ICD-10-CM | POA: Diagnosis not present

## 2015-01-11 DIAGNOSIS — M5126 Other intervertebral disc displacement, lumbar region: Secondary | ICD-10-CM | POA: Diagnosis not present

## 2015-01-11 DIAGNOSIS — M545 Low back pain: Secondary | ICD-10-CM | POA: Diagnosis not present

## 2015-01-11 DIAGNOSIS — M4317 Spondylolisthesis, lumbosacral region: Secondary | ICD-10-CM | POA: Diagnosis not present

## 2015-01-11 DIAGNOSIS — M4726 Other spondylosis with radiculopathy, lumbar region: Secondary | ICD-10-CM | POA: Diagnosis not present

## 2015-01-11 DIAGNOSIS — M5136 Other intervertebral disc degeneration, lumbar region: Secondary | ICD-10-CM | POA: Diagnosis not present

## 2015-01-11 DIAGNOSIS — M47816 Spondylosis without myelopathy or radiculopathy, lumbar region: Secondary | ICD-10-CM | POA: Diagnosis not present

## 2015-01-20 DIAGNOSIS — N281 Cyst of kidney, acquired: Secondary | ICD-10-CM | POA: Diagnosis not present

## 2015-01-20 DIAGNOSIS — N3941 Urge incontinence: Secondary | ICD-10-CM | POA: Diagnosis not present

## 2015-01-20 DIAGNOSIS — N3020 Other chronic cystitis without hematuria: Secondary | ICD-10-CM | POA: Diagnosis not present

## 2015-01-31 DIAGNOSIS — R590 Localized enlarged lymph nodes: Secondary | ICD-10-CM | POA: Diagnosis not present

## 2015-01-31 DIAGNOSIS — R221 Localized swelling, mass and lump, neck: Secondary | ICD-10-CM | POA: Diagnosis not present

## 2015-01-31 DIAGNOSIS — E041 Nontoxic single thyroid nodule: Secondary | ICD-10-CM | POA: Diagnosis not present

## 2015-02-15 ENCOUNTER — Other Ambulatory Visit: Payer: Self-pay

## 2015-02-15 DIAGNOSIS — E041 Nontoxic single thyroid nodule: Secondary | ICD-10-CM | POA: Diagnosis not present

## 2015-02-15 DIAGNOSIS — R221 Localized swelling, mass and lump, neck: Secondary | ICD-10-CM | POA: Diagnosis not present

## 2015-02-15 DIAGNOSIS — R220 Localized swelling, mass and lump, head: Secondary | ICD-10-CM | POA: Diagnosis not present

## 2015-02-25 DIAGNOSIS — E041 Nontoxic single thyroid nodule: Secondary | ICD-10-CM | POA: Diagnosis not present

## 2015-02-25 DIAGNOSIS — R590 Localized enlarged lymph nodes: Secondary | ICD-10-CM | POA: Diagnosis not present

## 2015-02-25 DIAGNOSIS — R221 Localized swelling, mass and lump, neck: Secondary | ICD-10-CM | POA: Diagnosis not present

## 2015-03-01 DIAGNOSIS — N951 Menopausal and female climacteric states: Secondary | ICD-10-CM | POA: Diagnosis not present

## 2015-03-01 DIAGNOSIS — E039 Hypothyroidism, unspecified: Secondary | ICD-10-CM | POA: Diagnosis not present

## 2015-03-01 DIAGNOSIS — Z01818 Encounter for other preprocedural examination: Secondary | ICD-10-CM | POA: Diagnosis not present

## 2015-03-01 DIAGNOSIS — Z79899 Other long term (current) drug therapy: Secondary | ICD-10-CM | POA: Diagnosis not present

## 2015-03-01 DIAGNOSIS — I10 Essential (primary) hypertension: Secondary | ICD-10-CM | POA: Diagnosis not present

## 2015-03-14 DIAGNOSIS — R221 Localized swelling, mass and lump, neck: Secondary | ICD-10-CM | POA: Diagnosis not present

## 2015-03-14 DIAGNOSIS — R590 Localized enlarged lymph nodes: Secondary | ICD-10-CM | POA: Diagnosis not present

## 2015-03-25 DIAGNOSIS — N3941 Urge incontinence: Secondary | ICD-10-CM | POA: Diagnosis not present

## 2015-03-25 DIAGNOSIS — N3020 Other chronic cystitis without hematuria: Secondary | ICD-10-CM | POA: Diagnosis not present

## 2015-03-29 ENCOUNTER — Other Ambulatory Visit: Payer: Self-pay

## 2015-03-29 DIAGNOSIS — E039 Hypothyroidism, unspecified: Secondary | ICD-10-CM | POA: Diagnosis not present

## 2015-03-29 DIAGNOSIS — R221 Localized swelling, mass and lump, neck: Secondary | ICD-10-CM | POA: Diagnosis not present

## 2015-03-29 DIAGNOSIS — R590 Localized enlarged lymph nodes: Secondary | ICD-10-CM | POA: Diagnosis not present

## 2015-03-29 DIAGNOSIS — I10 Essential (primary) hypertension: Secondary | ICD-10-CM | POA: Diagnosis not present

## 2015-03-29 DIAGNOSIS — Z7982 Long term (current) use of aspirin: Secondary | ICD-10-CM | POA: Diagnosis not present

## 2015-03-29 DIAGNOSIS — Z79899 Other long term (current) drug therapy: Secondary | ICD-10-CM | POA: Diagnosis not present

## 2015-03-29 DIAGNOSIS — R599 Enlarged lymph nodes, unspecified: Secondary | ICD-10-CM | POA: Diagnosis not present

## 2015-04-04 DIAGNOSIS — N390 Urinary tract infection, site not specified: Secondary | ICD-10-CM | POA: Diagnosis not present

## 2015-05-11 DIAGNOSIS — N3090 Cystitis, unspecified without hematuria: Secondary | ICD-10-CM | POA: Diagnosis not present

## 2015-07-12 DIAGNOSIS — R5383 Other fatigue: Secondary | ICD-10-CM | POA: Diagnosis not present

## 2015-07-12 DIAGNOSIS — I10 Essential (primary) hypertension: Secondary | ICD-10-CM | POA: Diagnosis not present

## 2015-07-12 DIAGNOSIS — E039 Hypothyroidism, unspecified: Secondary | ICD-10-CM | POA: Diagnosis not present

## 2015-07-12 DIAGNOSIS — R55 Syncope and collapse: Secondary | ICD-10-CM | POA: Diagnosis not present

## 2015-07-12 DIAGNOSIS — R109 Unspecified abdominal pain: Secondary | ICD-10-CM | POA: Diagnosis not present

## 2015-07-12 DIAGNOSIS — M2550 Pain in unspecified joint: Secondary | ICD-10-CM | POA: Diagnosis not present

## 2015-07-12 DIAGNOSIS — J029 Acute pharyngitis, unspecified: Secondary | ICD-10-CM | POA: Diagnosis not present

## 2015-07-26 DIAGNOSIS — R7989 Other specified abnormal findings of blood chemistry: Secondary | ICD-10-CM | POA: Diagnosis not present

## 2015-07-26 DIAGNOSIS — N189 Chronic kidney disease, unspecified: Secondary | ICD-10-CM | POA: Diagnosis not present

## 2015-08-02 DIAGNOSIS — N189 Chronic kidney disease, unspecified: Secondary | ICD-10-CM | POA: Diagnosis not present

## 2015-08-02 DIAGNOSIS — R7989 Other specified abnormal findings of blood chemistry: Secondary | ICD-10-CM | POA: Diagnosis not present

## 2015-08-19 DIAGNOSIS — N390 Urinary tract infection, site not specified: Secondary | ICD-10-CM | POA: Diagnosis not present

## 2015-08-19 DIAGNOSIS — M549 Dorsalgia, unspecified: Secondary | ICD-10-CM | POA: Diagnosis not present

## 2015-08-22 DIAGNOSIS — M4185 Other forms of scoliosis, thoracolumbar region: Secondary | ICD-10-CM | POA: Diagnosis not present

## 2015-08-22 DIAGNOSIS — M546 Pain in thoracic spine: Secondary | ICD-10-CM | POA: Diagnosis not present

## 2015-08-22 DIAGNOSIS — Z981 Arthrodesis status: Secondary | ICD-10-CM | POA: Diagnosis not present

## 2015-08-22 DIAGNOSIS — S299XXA Unspecified injury of thorax, initial encounter: Secondary | ICD-10-CM | POA: Diagnosis not present

## 2015-08-22 DIAGNOSIS — S3992XA Unspecified injury of lower back, initial encounter: Secondary | ICD-10-CM | POA: Diagnosis not present

## 2015-08-22 DIAGNOSIS — M47816 Spondylosis without myelopathy or radiculopathy, lumbar region: Secondary | ICD-10-CM | POA: Diagnosis not present

## 2015-08-22 DIAGNOSIS — M545 Low back pain: Secondary | ICD-10-CM | POA: Diagnosis not present

## 2015-08-25 DIAGNOSIS — M5126 Other intervertebral disc displacement, lumbar region: Secondary | ICD-10-CM | POA: Diagnosis not present

## 2015-08-25 DIAGNOSIS — M5136 Other intervertebral disc degeneration, lumbar region: Secondary | ICD-10-CM | POA: Diagnosis not present

## 2015-10-04 DIAGNOSIS — M961 Postlaminectomy syndrome, not elsewhere classified: Secondary | ICD-10-CM | POA: Diagnosis not present

## 2015-10-05 DIAGNOSIS — H04123 Dry eye syndrome of bilateral lacrimal glands: Secondary | ICD-10-CM | POA: Diagnosis not present

## 2015-10-05 DIAGNOSIS — H353131 Nonexudative age-related macular degeneration, bilateral, early dry stage: Secondary | ICD-10-CM | POA: Diagnosis not present

## 2015-10-27 DIAGNOSIS — T1490 Injury, unspecified: Secondary | ICD-10-CM | POA: Diagnosis not present

## 2015-10-27 DIAGNOSIS — N390 Urinary tract infection, site not specified: Secondary | ICD-10-CM | POA: Diagnosis not present

## 2015-11-22 DIAGNOSIS — R040 Epistaxis: Secondary | ICD-10-CM | POA: Diagnosis not present

## 2015-11-22 DIAGNOSIS — Z7901 Long term (current) use of anticoagulants: Secondary | ICD-10-CM | POA: Diagnosis not present

## 2015-11-22 DIAGNOSIS — I10 Essential (primary) hypertension: Secondary | ICD-10-CM | POA: Diagnosis not present

## 2015-12-07 DIAGNOSIS — R040 Epistaxis: Secondary | ICD-10-CM | POA: Diagnosis not present

## 2015-12-28 DIAGNOSIS — N390 Urinary tract infection, site not specified: Secondary | ICD-10-CM | POA: Diagnosis not present

## 2015-12-29 DIAGNOSIS — N3091 Cystitis, unspecified with hematuria: Secondary | ICD-10-CM | POA: Diagnosis not present

## 2016-01-03 DIAGNOSIS — R1900 Intra-abdominal and pelvic swelling, mass and lump, unspecified site: Secondary | ICD-10-CM | POA: Diagnosis not present

## 2016-01-03 DIAGNOSIS — S30861A Insect bite (nonvenomous) of abdominal wall, initial encounter: Secondary | ICD-10-CM | POA: Diagnosis not present

## 2016-01-03 DIAGNOSIS — Z79899 Other long term (current) drug therapy: Secondary | ICD-10-CM | POA: Diagnosis not present

## 2016-01-03 DIAGNOSIS — I10 Essential (primary) hypertension: Secondary | ICD-10-CM | POA: Diagnosis not present

## 2016-01-03 DIAGNOSIS — R5383 Other fatigue: Secondary | ICD-10-CM | POA: Diagnosis not present

## 2016-01-03 DIAGNOSIS — Z0001 Encounter for general adult medical examination with abnormal findings: Secondary | ICD-10-CM | POA: Diagnosis not present

## 2016-01-03 DIAGNOSIS — E039 Hypothyroidism, unspecified: Secondary | ICD-10-CM | POA: Diagnosis not present

## 2016-01-03 DIAGNOSIS — M810 Age-related osteoporosis without current pathological fracture: Secondary | ICD-10-CM | POA: Diagnosis not present

## 2016-01-03 DIAGNOSIS — K743 Primary biliary cirrhosis: Secondary | ICD-10-CM | POA: Diagnosis not present

## 2016-01-03 DIAGNOSIS — Z1389 Encounter for screening for other disorder: Secondary | ICD-10-CM | POA: Diagnosis not present

## 2016-01-03 DIAGNOSIS — K5900 Constipation, unspecified: Secondary | ICD-10-CM | POA: Diagnosis not present

## 2016-01-03 DIAGNOSIS — N189 Chronic kidney disease, unspecified: Secondary | ICD-10-CM | POA: Diagnosis not present

## 2016-01-09 ENCOUNTER — Other Ambulatory Visit: Payer: Self-pay

## 2016-01-11 DIAGNOSIS — H04123 Dry eye syndrome of bilateral lacrimal glands: Secondary | ICD-10-CM | POA: Diagnosis not present

## 2016-01-23 DIAGNOSIS — Z1231 Encounter for screening mammogram for malignant neoplasm of breast: Secondary | ICD-10-CM | POA: Diagnosis not present

## 2016-01-29 DIAGNOSIS — R300 Dysuria: Secondary | ICD-10-CM | POA: Diagnosis not present

## 2016-01-29 DIAGNOSIS — N3090 Cystitis, unspecified without hematuria: Secondary | ICD-10-CM | POA: Diagnosis not present

## 2016-01-30 DIAGNOSIS — K5900 Constipation, unspecified: Secondary | ICD-10-CM | POA: Diagnosis not present

## 2016-01-30 DIAGNOSIS — K745 Biliary cirrhosis, unspecified: Secondary | ICD-10-CM | POA: Diagnosis not present

## 2016-01-31 DIAGNOSIS — R300 Dysuria: Secondary | ICD-10-CM | POA: Diagnosis not present

## 2016-01-31 DIAGNOSIS — R109 Unspecified abdominal pain: Secondary | ICD-10-CM | POA: Diagnosis not present

## 2016-01-31 DIAGNOSIS — M545 Low back pain: Secondary | ICD-10-CM | POA: Diagnosis not present

## 2016-02-13 DIAGNOSIS — R197 Diarrhea, unspecified: Secondary | ICD-10-CM | POA: Diagnosis not present

## 2016-02-13 DIAGNOSIS — R5383 Other fatigue: Secondary | ICD-10-CM | POA: Diagnosis not present

## 2016-02-15 DIAGNOSIS — N390 Urinary tract infection, site not specified: Secondary | ICD-10-CM | POA: Diagnosis not present

## 2016-02-15 DIAGNOSIS — A0472 Enterocolitis due to Clostridium difficile, not specified as recurrent: Secondary | ICD-10-CM | POA: Diagnosis not present

## 2016-02-28 DIAGNOSIS — N189 Chronic kidney disease, unspecified: Secondary | ICD-10-CM | POA: Diagnosis not present

## 2016-04-02 DIAGNOSIS — L853 Xerosis cutis: Secondary | ICD-10-CM | POA: Diagnosis not present

## 2016-04-02 DIAGNOSIS — L301 Dyshidrosis [pompholyx]: Secondary | ICD-10-CM | POA: Diagnosis not present

## 2016-04-02 DIAGNOSIS — E039 Hypothyroidism, unspecified: Secondary | ICD-10-CM | POA: Diagnosis not present

## 2016-04-02 DIAGNOSIS — I10 Essential (primary) hypertension: Secondary | ICD-10-CM | POA: Diagnosis not present

## 2016-04-02 DIAGNOSIS — M5136 Other intervertebral disc degeneration, lumbar region: Secondary | ICD-10-CM | POA: Diagnosis not present

## 2016-04-02 DIAGNOSIS — N189 Chronic kidney disease, unspecified: Secondary | ICD-10-CM | POA: Diagnosis not present

## 2016-04-09 DIAGNOSIS — N390 Urinary tract infection, site not specified: Secondary | ICD-10-CM | POA: Diagnosis not present

## 2016-05-12 DIAGNOSIS — R509 Fever, unspecified: Secondary | ICD-10-CM | POA: Diagnosis not present

## 2016-05-12 DIAGNOSIS — J069 Acute upper respiratory infection, unspecified: Secondary | ICD-10-CM | POA: Diagnosis not present

## 2016-05-21 DIAGNOSIS — J9801 Acute bronchospasm: Secondary | ICD-10-CM | POA: Diagnosis not present

## 2016-05-21 DIAGNOSIS — R05 Cough: Secondary | ICD-10-CM | POA: Diagnosis not present

## 2016-05-21 DIAGNOSIS — J40 Bronchitis, not specified as acute or chronic: Secondary | ICD-10-CM | POA: Diagnosis not present

## 2016-06-14 DIAGNOSIS — H019 Unspecified inflammation of eyelid: Secondary | ICD-10-CM | POA: Diagnosis not present

## 2016-06-29 DIAGNOSIS — E039 Hypothyroidism, unspecified: Secondary | ICD-10-CM | POA: Insufficient documentation

## 2016-06-29 DIAGNOSIS — N3281 Overactive bladder: Secondary | ICD-10-CM | POA: Insufficient documentation

## 2016-06-29 DIAGNOSIS — I10 Essential (primary) hypertension: Secondary | ICD-10-CM | POA: Insufficient documentation

## 2016-06-29 DIAGNOSIS — N3946 Mixed incontinence: Secondary | ICD-10-CM | POA: Diagnosis not present

## 2016-06-29 DIAGNOSIS — R3129 Other microscopic hematuria: Secondary | ICD-10-CM | POA: Diagnosis not present

## 2016-06-29 DIAGNOSIS — R351 Nocturia: Secondary | ICD-10-CM | POA: Insufficient documentation

## 2016-07-03 DIAGNOSIS — E039 Hypothyroidism, unspecified: Secondary | ICD-10-CM | POA: Diagnosis not present

## 2016-07-03 DIAGNOSIS — Z79899 Other long term (current) drug therapy: Secondary | ICD-10-CM | POA: Diagnosis not present

## 2016-07-03 DIAGNOSIS — H019 Unspecified inflammation of eyelid: Secondary | ICD-10-CM | POA: Diagnosis not present

## 2016-07-03 DIAGNOSIS — I10 Essential (primary) hypertension: Secondary | ICD-10-CM | POA: Diagnosis not present

## 2016-07-16 DIAGNOSIS — H02055 Trichiasis without entropian left lower eyelid: Secondary | ICD-10-CM | POA: Diagnosis not present

## 2016-07-16 DIAGNOSIS — H04123 Dry eye syndrome of bilateral lacrimal glands: Secondary | ICD-10-CM | POA: Diagnosis not present

## 2016-07-17 DIAGNOSIS — L209 Atopic dermatitis, unspecified: Secondary | ICD-10-CM | POA: Diagnosis not present

## 2016-07-17 DIAGNOSIS — L299 Pruritus, unspecified: Secondary | ICD-10-CM | POA: Diagnosis not present

## 2016-07-24 DIAGNOSIS — N3281 Overactive bladder: Secondary | ICD-10-CM | POA: Diagnosis not present

## 2016-07-24 DIAGNOSIS — R351 Nocturia: Secondary | ICD-10-CM | POA: Diagnosis not present

## 2016-07-24 DIAGNOSIS — N3946 Mixed incontinence: Secondary | ICD-10-CM | POA: Diagnosis not present

## 2016-07-28 DIAGNOSIS — N390 Urinary tract infection, site not specified: Secondary | ICD-10-CM | POA: Diagnosis not present

## 2016-07-28 DIAGNOSIS — N3001 Acute cystitis with hematuria: Secondary | ICD-10-CM | POA: Diagnosis not present

## 2016-08-23 DIAGNOSIS — N3281 Overactive bladder: Secondary | ICD-10-CM | POA: Diagnosis not present

## 2016-08-23 DIAGNOSIS — N8189 Other female genital prolapse: Secondary | ICD-10-CM | POA: Diagnosis not present

## 2016-08-23 DIAGNOSIS — N3946 Mixed incontinence: Secondary | ICD-10-CM | POA: Diagnosis not present

## 2016-08-23 DIAGNOSIS — R351 Nocturia: Secondary | ICD-10-CM | POA: Diagnosis not present

## 2016-08-28 DIAGNOSIS — Z01818 Encounter for other preprocedural examination: Secondary | ICD-10-CM | POA: Diagnosis not present

## 2016-08-29 DIAGNOSIS — N183 Chronic kidney disease, stage 3 unspecified: Secondary | ICD-10-CM | POA: Insufficient documentation

## 2016-08-29 DIAGNOSIS — I863 Vulval varices: Secondary | ICD-10-CM | POA: Diagnosis not present

## 2016-08-30 DIAGNOSIS — I10 Essential (primary) hypertension: Secondary | ICD-10-CM | POA: Diagnosis not present

## 2016-08-30 DIAGNOSIS — Z01818 Encounter for other preprocedural examination: Secondary | ICD-10-CM | POA: Diagnosis not present

## 2016-09-03 DIAGNOSIS — N8111 Cystocele, midline: Secondary | ICD-10-CM | POA: Diagnosis not present

## 2016-09-03 DIAGNOSIS — N8110 Cystocele, unspecified: Secondary | ICD-10-CM | POA: Diagnosis not present

## 2016-09-03 DIAGNOSIS — N183 Chronic kidney disease, stage 3 (moderate): Secondary | ICD-10-CM | POA: Diagnosis not present

## 2016-09-03 DIAGNOSIS — N993 Prolapse of vaginal vault after hysterectomy: Secondary | ICD-10-CM | POA: Diagnosis not present

## 2016-09-03 DIAGNOSIS — E039 Hypothyroidism, unspecified: Secondary | ICD-10-CM | POA: Diagnosis not present

## 2016-09-03 DIAGNOSIS — N3281 Overactive bladder: Secondary | ICD-10-CM | POA: Diagnosis not present

## 2016-09-03 DIAGNOSIS — Z79899 Other long term (current) drug therapy: Secondary | ICD-10-CM | POA: Diagnosis not present

## 2016-09-03 DIAGNOSIS — N3946 Mixed incontinence: Secondary | ICD-10-CM | POA: Diagnosis not present

## 2016-09-04 DIAGNOSIS — N993 Prolapse of vaginal vault after hysterectomy: Secondary | ICD-10-CM | POA: Diagnosis not present

## 2016-09-04 DIAGNOSIS — Z79899 Other long term (current) drug therapy: Secondary | ICD-10-CM | POA: Diagnosis not present

## 2016-09-04 DIAGNOSIS — N183 Chronic kidney disease, stage 3 (moderate): Secondary | ICD-10-CM | POA: Diagnosis not present

## 2016-09-04 DIAGNOSIS — E039 Hypothyroidism, unspecified: Secondary | ICD-10-CM | POA: Diagnosis not present

## 2016-10-31 DIAGNOSIS — M5136 Other intervertebral disc degeneration, lumbar region: Secondary | ICD-10-CM | POA: Diagnosis not present

## 2016-10-31 DIAGNOSIS — K743 Primary biliary cirrhosis: Secondary | ICD-10-CM | POA: Diagnosis not present

## 2016-10-31 DIAGNOSIS — N189 Chronic kidney disease, unspecified: Secondary | ICD-10-CM | POA: Diagnosis not present

## 2016-11-07 DIAGNOSIS — M7989 Other specified soft tissue disorders: Secondary | ICD-10-CM | POA: Diagnosis not present

## 2016-11-07 DIAGNOSIS — M79661 Pain in right lower leg: Secondary | ICD-10-CM | POA: Diagnosis not present

## 2016-11-07 DIAGNOSIS — R2241 Localized swelling, mass and lump, right lower limb: Secondary | ICD-10-CM | POA: Diagnosis not present

## 2016-11-10 DIAGNOSIS — R300 Dysuria: Secondary | ICD-10-CM | POA: Diagnosis not present

## 2016-11-10 DIAGNOSIS — N3090 Cystitis, unspecified without hematuria: Secondary | ICD-10-CM | POA: Diagnosis not present

## 2016-11-22 DIAGNOSIS — R300 Dysuria: Secondary | ICD-10-CM | POA: Diagnosis not present

## 2016-11-22 DIAGNOSIS — N898 Other specified noninflammatory disorders of vagina: Secondary | ICD-10-CM | POA: Diagnosis not present

## 2016-11-27 DIAGNOSIS — R3129 Other microscopic hematuria: Secondary | ICD-10-CM | POA: Diagnosis not present

## 2016-12-02 DIAGNOSIS — N3001 Acute cystitis with hematuria: Secondary | ICD-10-CM | POA: Diagnosis not present

## 2016-12-02 DIAGNOSIS — R102 Pelvic and perineal pain: Secondary | ICD-10-CM | POA: Diagnosis not present

## 2016-12-10 DIAGNOSIS — N390 Urinary tract infection, site not specified: Secondary | ICD-10-CM | POA: Diagnosis not present

## 2016-12-10 DIAGNOSIS — R197 Diarrhea, unspecified: Secondary | ICD-10-CM | POA: Diagnosis not present

## 2016-12-10 DIAGNOSIS — M25572 Pain in left ankle and joints of left foot: Secondary | ICD-10-CM | POA: Diagnosis not present

## 2016-12-11 DIAGNOSIS — N390 Urinary tract infection, site not specified: Secondary | ICD-10-CM | POA: Diagnosis not present

## 2016-12-12 DIAGNOSIS — N390 Urinary tract infection, site not specified: Secondary | ICD-10-CM | POA: Diagnosis not present

## 2016-12-27 DIAGNOSIS — R339 Retention of urine, unspecified: Secondary | ICD-10-CM | POA: Diagnosis not present

## 2016-12-27 DIAGNOSIS — N390 Urinary tract infection, site not specified: Secondary | ICD-10-CM | POA: Diagnosis not present

## 2016-12-27 DIAGNOSIS — N8111 Cystocele, midline: Secondary | ICD-10-CM | POA: Diagnosis not present

## 2016-12-27 DIAGNOSIS — R300 Dysuria: Secondary | ICD-10-CM | POA: Diagnosis not present

## 2017-01-10 DIAGNOSIS — M25572 Pain in left ankle and joints of left foot: Secondary | ICD-10-CM | POA: Diagnosis not present

## 2017-01-10 DIAGNOSIS — Z1389 Encounter for screening for other disorder: Secondary | ICD-10-CM | POA: Diagnosis not present

## 2017-01-13 DIAGNOSIS — R300 Dysuria: Secondary | ICD-10-CM | POA: Diagnosis not present

## 2017-01-13 DIAGNOSIS — N3001 Acute cystitis with hematuria: Secondary | ICD-10-CM | POA: Diagnosis not present

## 2017-01-24 DIAGNOSIS — M659 Synovitis and tenosynovitis, unspecified: Secondary | ICD-10-CM | POA: Diagnosis not present

## 2017-01-24 DIAGNOSIS — M25572 Pain in left ankle and joints of left foot: Secondary | ICD-10-CM | POA: Diagnosis not present

## 2017-02-26 ENCOUNTER — Encounter: Payer: Self-pay | Admitting: Podiatry

## 2017-02-26 ENCOUNTER — Ambulatory Visit (INDEPENDENT_AMBULATORY_CARE_PROVIDER_SITE_OTHER): Payer: Medicare Other | Admitting: Podiatry

## 2017-02-26 ENCOUNTER — Ambulatory Visit (INDEPENDENT_AMBULATORY_CARE_PROVIDER_SITE_OTHER): Payer: Medicare Other

## 2017-02-26 VITALS — BP 120/65 | HR 72 | Ht 67.0 in | Wt 162.0 lb

## 2017-02-26 DIAGNOSIS — M792 Neuralgia and neuritis, unspecified: Secondary | ICD-10-CM | POA: Diagnosis not present

## 2017-02-26 DIAGNOSIS — M19079 Primary osteoarthritis, unspecified ankle and foot: Secondary | ICD-10-CM

## 2017-02-26 DIAGNOSIS — M779 Enthesopathy, unspecified: Secondary | ICD-10-CM

## 2017-02-26 DIAGNOSIS — M7750 Other enthesopathy of unspecified foot: Secondary | ICD-10-CM | POA: Diagnosis not present

## 2017-02-26 DIAGNOSIS — M2170 Unequal limb length (acquired), unspecified site: Secondary | ICD-10-CM | POA: Diagnosis not present

## 2017-02-26 NOTE — Progress Notes (Signed)
Subjective:    Patient ID: Jill Fletcher, female    DOB: 09-Oct-1940, 76 y.o.   MRN: [id number]   HPI Chief Complaint  Patient presents with  . Foot Pain    Right foot pain swells and turns red making it very difficult to sleep   . Ankle Pain    Left ankle swells all the time saw Dr Gretta Arab not happy with care   . Leg Problem    possible leg length discrepency left knee replacement     76 y.o. female presents with the above complaint. Complains of bilateral foot pain. States that she believes she has a limb length discrepancy d/t having L knee replaced. Reports pain in the top of R foot described as burning. Hx of low back issues with fibromyalgia. Reports R ankle swelling but has not tried prior treatments.  Past Medical History:  Diagnosis Date  . Fibromyalgia   . GERD (gastroesophageal reflux disease)   . Hypercholesteremia   . Hypertension   . Hypothyroidism   . Osteoarthritis of spine   . PONV (postoperative nausea and vomiting)    yrs ago, 08-03-2004 back xray on chart per dr Alvan Dame request  . Primary biliary cirrhosis [facility])    Past Surgical History:  Procedure Laterality Date  . ABDOMINAL HYSTERECTOMY    . APPENDECTOMY    . bladder tach  yrs ago  . BUNIONECTOMY Bilateral yrs ago  . CHOLECYSTECTOMY    . hysterectomy----unknown    . lumbar surgery ----- unknown    . MASTOIDECTOMY    . right shoulder surgery  yrs ago  . TONSILLECTOMY    . TOTAL KNEE ARTHROPLASTY Left 08/12/2012   Procedure: TOTAL KNEE ARTHROPLASTY;  Surgeon: Mauri Pole, MD;  Location: [facility];  Service: Orthopedics;  Laterality: Left;    Current Outpatient Prescriptions:  .  aspirin EC 325 MG tablet, Take 1 tablet (325 mg total) by mouth 2 (two) times daily., Disp: 60 tablet, Rfl: 0 .  B Complex-Biotin-FA (B-COMPLEX PO), Take 1 capsule by mouth daily. , Disp: , Rfl:  .  Calcium Carbonate-Vitamin D (CALCIUM + D PO), Take 1 tablet by mouth daily., Disp: , Rfl:  .  Cholecalciferol (VITAMIN D3) 1000  UNITS CAPS, Take 1,000 Units by mouth daily., Disp: , Rfl:  .  Coenzyme Q10 200 MG capsule, Take 200 mg by mouth daily., Disp: , Rfl:  .  estradiol (VIVELLE-DOT) 0.05 MG/24HR, Place 1 patch onto the skin once a week., Disp: , Rfl:  .  levothyroxine (SYNTHROID, LEVOTHROID) 75 MCG tablet, Take 75 mcg by mouth daily before breakfast. , Disp: , Rfl:  .  Milk Thistle 1000 MG CAPS, Take 1 capsule by mouth daily., Disp: , Rfl:  .  Multiple Vitamins-Minerals (MULTIVITAMIN PO), Take 1 tablet by mouth daily., Disp: , Rfl:  .  pantoprazole (PROTONIX) 40 MG tablet, Take 40 mg by mouth every morning. , Disp: , Rfl:  .  vitamin C (ASCORBIC ACID) 500 MG tablet, Take 500 mg by mouth daily., Disp: , Rfl:  .  Cranberry (SM CRANBERRY) 300 MG tablet, Take 300 mg by mouth daily., Disp: , Rfl:  .  docusate sodium 100 MG CAPS, Take 100 mg by mouth 2 (two) times daily. (Patient not taking: Reported on 02/26/2017), Disp: 10 capsule, Rfl:  .  ferrous sulfate 325 (65 FE) MG tablet, Take 1 tablet (325 mg total) by mouth 3 (three) times daily after meals. (Patient not taking: Reported on 02/26/2017), Disp: , Rfl:  .  FIBER PO, Take 2 tablets by mouth 2 (two) times daily. , Disp: , Rfl:  .  fish oil-omega-3 fatty acids 1000 MG capsule, Take 1 g by mouth daily. , Disp: , Rfl:  .  Magnesium 250 MG TABS, Take 250 mg by mouth daily., Disp: , Rfl:  .  metoprolol succinate (TOPROL-XL) 50 MG 24 hr tablet, Take 50 mg by mouth daily before breakfast. Take with or immediately following a meal., Disp: , Rfl:  .  oxyCODONE (OXY IR/ROXICODONE) 5 MG immediate release tablet, Take 1-2 tablets (5-10 mg total) by mouth every 4 (four) hours as needed for pain. (Patient not taking: Reported on 02/26/2017), Disp: 120 tablet, Rfl: 0 .  polyethylene glycol (MIRALAX / GLYCOLAX) packet, Take 17 g by mouth 2 (two) times daily. (Patient not taking: Reported on 02/26/2017), Disp: 14 each, Rfl:  .  tiZANidine (ZANAFLEX) 4 MG capsule, Take 1 capsule (4 mg  total) by mouth 3 (three) times daily. Muscle spasms (Patient not taking: Reported on 02/26/2017), Disp: 50 capsule, Rfl: 0 .  ursodiol (ACTIGALL) 500 MG tablet, Take 500 mg by mouth 2 (two) times daily., Disp: , Rfl:   Allergies  Allergen Reactions  . Tylenol [Acetaminophen]     Patient has Primary Biliary Cirrhosis   . Doxycycline     And like medications. Heart pappitations and itching  . Levaquin [Levofloxacin] Other (See Comments)    Heart pounding and itching   . Other Itching    ALL MYCINS & CILLINS AND CYCLINS-CANT SLEEP AND HEART POUNDING  . Penicillins Other (See Comments)    Whelps at injections site and itching.  Patient states she tolerates Ceftin.     . Codeine Palpitations    Review of Systems  Constitutional: Positive for chills and fatigue.  HENT: Positive for hearing loss.   Endocrine: Positive for cold intolerance.  Genitourinary: Positive for frequency.  Musculoskeletal: Positive for arthralgias, back pain, gait problem and myalgias.  Neurological: Positive for weakness and numbness.  Hematological: Bruises/bleeds easily.      Objective:   Physical Exam Vitals:   02/26/17 0846  BP: 120/65  Pulse: 72   General AA&O x3. Normal mood and affect.  Vascular Dorsalis pedis and posterior tibial pulses  present 2+ bilaterally  Capillary refill normal to all digits. Pedal hair growth normal. Significant varicosities present bilaterally   Neurologic Epicritic sensation grossly present. Hyperesthesias from approximately mid foot distal. Negative Tinel's over the tarsal tunnel   Dermatologic No open lesions. Interspaces clear of maceration. Nails well groomed and normal in appearance.  Orthopedic: MMT 5/5 in dorsiflexion, plantarflexion, inversion, and eversion. Normal joint ROM without pain or crepitus.] Pain to palpation right dorsal midfoot with prominent mid foot osteophytes  L Tibial Crest to Medial Malleolus: 37 cm R Tibial Crest to Meidal Malleolus:  34 cm   Radiographs: Taken reviewed evidence of prior bunionectomies bilateral first metatarsals. Degenerative changes noted in the midfoot bilat    Assessment & Plan:  Patient was evaluated and treated and all questions answered  Midfoot arthritis right -X-rays reviewed as above -Midfoot injection delivered as below -Discussed lacing techniques for dorsal midfoot prominence  Procedure: Joint Injection Location: Right tarsometatarsal joints Skin Prep: Alcohol. Injectate: 0.5 cc 1% lidocaine plain, 0.5 cc dexamethasone phosphate. Disposition: Patient tolerated procedure well. Injection site dressed with a band-aid.  Limb length discrepancy -Approximately 1 inch lower discrepancy left longer than right -Will make appointment with orthotist for possible inserts/shoe modification/heel lift  Neuralgia -Discussed that since the  issues are coming from her spine there is little that can be done at the pedal level

## 2017-03-05 DIAGNOSIS — N390 Urinary tract infection, site not specified: Secondary | ICD-10-CM | POA: Diagnosis not present

## 2017-03-13 DIAGNOSIS — R319 Hematuria, unspecified: Secondary | ICD-10-CM | POA: Diagnosis not present

## 2017-03-13 DIAGNOSIS — N8189 Other female genital prolapse: Secondary | ICD-10-CM | POA: Diagnosis not present

## 2017-03-13 DIAGNOSIS — N952 Postmenopausal atrophic vaginitis: Secondary | ICD-10-CM | POA: Diagnosis not present

## 2017-03-13 DIAGNOSIS — R310 Gross hematuria: Secondary | ICD-10-CM | POA: Diagnosis not present

## 2017-03-13 DIAGNOSIS — N390 Urinary tract infection, site not specified: Secondary | ICD-10-CM | POA: Diagnosis not present

## 2017-03-14 ENCOUNTER — Other Ambulatory Visit: Payer: Medicare Other | Admitting: Orthotics

## 2017-03-25 ENCOUNTER — Ambulatory Visit (INDEPENDENT_AMBULATORY_CARE_PROVIDER_SITE_OTHER): Payer: Medicare Other | Admitting: Podiatry

## 2017-03-25 DIAGNOSIS — M779 Enthesopathy, unspecified: Secondary | ICD-10-CM

## 2017-03-25 DIAGNOSIS — M19079 Primary osteoarthritis, unspecified ankle and foot: Secondary | ICD-10-CM | POA: Diagnosis not present

## 2017-03-28 ENCOUNTER — Ambulatory Visit (INDEPENDENT_AMBULATORY_CARE_PROVIDER_SITE_OTHER): Payer: Medicare Other | Admitting: *Deleted

## 2017-03-28 ENCOUNTER — Other Ambulatory Visit: Payer: Medicare Other

## 2017-03-28 DIAGNOSIS — M779 Enthesopathy, unspecified: Secondary | ICD-10-CM

## 2017-03-28 DIAGNOSIS — M2170 Unequal limb length (acquired), unspecified site: Secondary | ICD-10-CM

## 2017-03-29 NOTE — Progress Notes (Signed)
Patient ID: Jill Fletcher, female   DOB: 1940-07-27, 76 y.o.   MRN: [id number]   Patient presents to be casted for custom molded orthotics with [facility] Certified Pedorthist.  Patient aware they are not covered by insurance and signed the ABN.  Order sent to [facility] labs for LW accomodative full length Spenco top cover with 3/4" lift on the right heel.  Patient will return in 4 weeks to be fitted.

## 2017-04-03 DIAGNOSIS — R828 Abnormal findings on cytological and histological examination of urine: Secondary | ICD-10-CM | POA: Diagnosis not present

## 2017-04-03 DIAGNOSIS — Z87440 Personal history of urinary (tract) infections: Secondary | ICD-10-CM | POA: Diagnosis not present

## 2017-04-03 DIAGNOSIS — R399 Unspecified symptoms and signs involving the genitourinary system: Secondary | ICD-10-CM | POA: Diagnosis not present

## 2017-04-03 DIAGNOSIS — N3289 Other specified disorders of bladder: Secondary | ICD-10-CM | POA: Diagnosis not present

## 2017-04-03 DIAGNOSIS — N8111 Cystocele, midline: Secondary | ICD-10-CM | POA: Diagnosis not present

## 2017-04-03 DIAGNOSIS — N3010 Interstitial cystitis (chronic) without hematuria: Secondary | ICD-10-CM | POA: Diagnosis not present

## 2017-04-03 DIAGNOSIS — R350 Frequency of micturition: Secondary | ICD-10-CM | POA: Diagnosis not present

## 2017-04-03 DIAGNOSIS — R3129 Other microscopic hematuria: Secondary | ICD-10-CM | POA: Diagnosis not present

## 2017-04-03 DIAGNOSIS — R339 Retention of urine, unspecified: Secondary | ICD-10-CM | POA: Diagnosis not present

## 2017-04-03 DIAGNOSIS — N398 Other specified disorders of urinary system: Secondary | ICD-10-CM | POA: Diagnosis not present

## 2017-04-07 NOTE — Progress Notes (Signed)
  Subjective:  Patient ID: Jill Fletcher, female    DOB: 1940/08/28,  MRN: [id number]  Chief Complaint  Patient presents with  . Foot Pain    Bil foot pain. Still having pain at times   76 y.o. female returns for the above complaint.  She is still having some pain at times.  States that the injection did help for a good period of time.  Objective:  There were no vitals filed for this visit. General AA&O x3. Normal mood and affect.  Vascular Pedal pulses palpable.  Neurologic Epicritic sensation grossly intact.  Dermatologic No open lesions. Skin normal texture and turgor.  Orthopedic: No pain to palpation either foot.   Assessment & Plan:  Patient was evaluated and treated and all questions answered.  Midfoot OA -Improving -Not actively inflamed will avoid injection at this time. -F/up in 2 months for midfoot injection  Return in about 2 months (around 05/25/2017) for midfoot injection.

## 2017-05-02 ENCOUNTER — Ambulatory Visit (INDEPENDENT_AMBULATORY_CARE_PROVIDER_SITE_OTHER): Payer: Medicare Other | Admitting: *Deleted

## 2017-05-02 DIAGNOSIS — M2170 Unequal limb length (acquired), unspecified site: Secondary | ICD-10-CM

## 2017-05-02 DIAGNOSIS — M19079 Primary osteoarthritis, unspecified ankle and foot: Secondary | ICD-10-CM

## 2017-05-02 DIAGNOSIS — M792 Neuralgia and neuritis, unspecified: Secondary | ICD-10-CM

## 2017-05-02 NOTE — Patient Instructions (Signed)

## 2017-05-17 DIAGNOSIS — N390 Urinary tract infection, site not specified: Secondary | ICD-10-CM | POA: Diagnosis not present

## 2017-05-23 DIAGNOSIS — R197 Diarrhea, unspecified: Secondary | ICD-10-CM | POA: Diagnosis not present

## 2017-05-27 ENCOUNTER — Encounter: Payer: Self-pay | Admitting: Podiatry

## 2017-05-27 ENCOUNTER — Ambulatory Visit (INDEPENDENT_AMBULATORY_CARE_PROVIDER_SITE_OTHER): Payer: Medicare Other | Admitting: Podiatry

## 2017-05-27 DIAGNOSIS — M2170 Unequal limb length (acquired), unspecified site: Secondary | ICD-10-CM

## 2017-05-27 DIAGNOSIS — M779 Enthesopathy, unspecified: Secondary | ICD-10-CM | POA: Diagnosis not present

## 2017-05-27 DIAGNOSIS — M19079 Primary osteoarthritis, unspecified ankle and foot: Secondary | ICD-10-CM

## 2017-05-27 NOTE — Progress Notes (Signed)
  Subjective:  Patient ID: Jill Fletcher, female    DOB: 1941/01/18,  MRN: [id number]  No chief complaint on file.  77 y.o. female returns for the above complaint.  That she feels her orthotics need a little more left on her right side.  Reports some pain in the right foot about medial ankle not having pain in the top of the foot today.  Objective:  There were no vitals filed for this visit. General AA&O x3. Normal mood and affect.  Vascular Pedal pulses palpable.  Neurologic Epicritic sensation grossly intact.  Dermatologic No open lesions. Skin normal texture and turgor.  Orthopedic:  Pain to palpation right navicular tuberosity   Assessment & Plan:  Patient was evaluated and treated and all questions answered.  Midfoot OA -Asymptomatic. No injection today.  LLD -Feels that her orthotics are not providing enough lift.  PT insertional tendonitis; accessory navicular -Injection delivered navicular insertion as below.  Procedure: Tendon Injection Location: Right PT tendon inserion Skin Prep: Alcohol. Injectate: 0.5 cc 1% lidocaine plain, 0.5 cc dexamethasone phosphate. Disposition: Patient tolerated procedure well. Injection site dressed with a band-aid.  Return in about 6 weeks (around 07/08/2017) for Tendonitis.

## 2017-05-30 DIAGNOSIS — N812 Incomplete uterovaginal prolapse: Secondary | ICD-10-CM | POA: Diagnosis not present

## 2017-05-30 DIAGNOSIS — Z888 Allergy status to other drugs, medicaments and biological substances status: Secondary | ICD-10-CM | POA: Diagnosis not present

## 2017-05-30 DIAGNOSIS — N819 Female genital prolapse, unspecified: Secondary | ICD-10-CM | POA: Insufficient documentation

## 2017-05-30 DIAGNOSIS — Z881 Allergy status to other antibiotic agents status: Secondary | ICD-10-CM | POA: Diagnosis not present

## 2017-05-30 DIAGNOSIS — N8111 Cystocele, midline: Secondary | ICD-10-CM | POA: Diagnosis not present

## 2017-05-30 DIAGNOSIS — Z9189 Other specified personal risk factors, not elsewhere classified: Secondary | ICD-10-CM | POA: Diagnosis not present

## 2017-05-30 DIAGNOSIS — Z880 Allergy status to penicillin: Secondary | ICD-10-CM | POA: Diagnosis not present

## 2017-05-30 DIAGNOSIS — Z79899 Other long term (current) drug therapy: Secondary | ICD-10-CM | POA: Diagnosis not present

## 2017-05-30 DIAGNOSIS — E039 Hypothyroidism, unspecified: Secondary | ICD-10-CM | POA: Diagnosis not present

## 2017-05-30 DIAGNOSIS — K219 Gastro-esophageal reflux disease without esophagitis: Secondary | ICD-10-CM | POA: Diagnosis not present

## 2017-05-31 DIAGNOSIS — Z881 Allergy status to other antibiotic agents status: Secondary | ICD-10-CM | POA: Diagnosis not present

## 2017-05-31 DIAGNOSIS — N819 Female genital prolapse, unspecified: Secondary | ICD-10-CM | POA: Diagnosis not present

## 2017-05-31 DIAGNOSIS — E039 Hypothyroidism, unspecified: Secondary | ICD-10-CM | POA: Diagnosis not present

## 2017-05-31 DIAGNOSIS — K219 Gastro-esophageal reflux disease without esophagitis: Secondary | ICD-10-CM | POA: Diagnosis not present

## 2017-05-31 DIAGNOSIS — Z880 Allergy status to penicillin: Secondary | ICD-10-CM | POA: Diagnosis not present

## 2017-05-31 DIAGNOSIS — N8111 Cystocele, midline: Secondary | ICD-10-CM | POA: Diagnosis not present

## 2017-06-20 ENCOUNTER — Other Ambulatory Visit: Payer: Medicare Other

## 2017-07-08 ENCOUNTER — Ambulatory Visit: Payer: Medicare Other | Admitting: Podiatry

## 2017-07-08 DIAGNOSIS — M797 Fibromyalgia: Secondary | ICD-10-CM | POA: Insufficient documentation

## 2017-07-08 DIAGNOSIS — M1990 Unspecified osteoarthritis, unspecified site: Secondary | ICD-10-CM | POA: Insufficient documentation

## 2017-07-08 DIAGNOSIS — K745 Biliary cirrhosis, unspecified: Secondary | ICD-10-CM | POA: Insufficient documentation

## 2017-07-10 DIAGNOSIS — N8111 Cystocele, midline: Secondary | ICD-10-CM | POA: Diagnosis not present

## 2017-07-10 DIAGNOSIS — R8290 Unspecified abnormal findings in urine: Secondary | ICD-10-CM | POA: Diagnosis not present

## 2017-07-10 DIAGNOSIS — Z09 Encounter for follow-up examination after completed treatment for conditions other than malignant neoplasm: Secondary | ICD-10-CM | POA: Diagnosis not present

## 2017-07-10 DIAGNOSIS — N393 Stress incontinence (female) (male): Secondary | ICD-10-CM | POA: Insufficient documentation

## 2017-07-15 DIAGNOSIS — H04123 Dry eye syndrome of bilateral lacrimal glands: Secondary | ICD-10-CM | POA: Diagnosis not present

## 2017-07-25 ENCOUNTER — Ambulatory Visit: Payer: Medicare Other | Admitting: *Deleted

## 2017-07-25 DIAGNOSIS — M779 Enthesopathy, unspecified: Secondary | ICD-10-CM

## 2017-08-27 DIAGNOSIS — Z1231 Encounter for screening mammogram for malignant neoplasm of breast: Secondary | ICD-10-CM | POA: Diagnosis not present

## 2017-10-14 DIAGNOSIS — Z0000 Encounter for general adult medical examination without abnormal findings: Secondary | ICD-10-CM | POA: Diagnosis not present

## 2017-10-14 DIAGNOSIS — K743 Primary biliary cirrhosis: Secondary | ICD-10-CM | POA: Diagnosis not present

## 2017-10-14 DIAGNOSIS — Z1389 Encounter for screening for other disorder: Secondary | ICD-10-CM | POA: Diagnosis not present

## 2017-10-14 DIAGNOSIS — E039 Hypothyroidism, unspecified: Secondary | ICD-10-CM | POA: Diagnosis not present

## 2017-10-14 DIAGNOSIS — Z1331 Encounter for screening for depression: Secondary | ICD-10-CM | POA: Diagnosis not present

## 2017-10-14 DIAGNOSIS — E559 Vitamin D deficiency, unspecified: Secondary | ICD-10-CM | POA: Diagnosis not present

## 2017-10-14 DIAGNOSIS — N189 Chronic kidney disease, unspecified: Secondary | ICD-10-CM | POA: Diagnosis not present

## 2017-10-14 DIAGNOSIS — Z0001 Encounter for general adult medical examination with abnormal findings: Secondary | ICD-10-CM | POA: Diagnosis not present

## 2017-10-14 DIAGNOSIS — Z79899 Other long term (current) drug therapy: Secondary | ICD-10-CM | POA: Diagnosis not present

## 2017-10-14 DIAGNOSIS — M79671 Pain in right foot: Secondary | ICD-10-CM | POA: Diagnosis not present

## 2017-10-18 DIAGNOSIS — M79671 Pain in right foot: Secondary | ICD-10-CM | POA: Diagnosis not present

## 2017-10-28 ENCOUNTER — Encounter: Payer: Self-pay | Admitting: Podiatry

## 2017-10-28 ENCOUNTER — Ambulatory Visit (INDEPENDENT_AMBULATORY_CARE_PROVIDER_SITE_OTHER): Payer: Medicare Other | Admitting: Podiatry

## 2017-10-28 DIAGNOSIS — M779 Enthesopathy, unspecified: Secondary | ICD-10-CM

## 2017-10-28 MED ORDER — MELOXICAM 7.5 MG PO TABS: 7.5000 mg | ORAL_TABLET | Freq: Every day | ORAL | 0 refills | Status: DC

## 2017-10-28 NOTE — Progress Notes (Signed)
  Subjective:  Patient ID: Jill Fletcher, female    DOB: 20-Dec-1940,  MRN: [id number]  Chief Complaint  Patient presents with  . Foot Pain    F/U Right foot pain Pt.stated," I have severe pain; 8/10 sharp through my toes and top of foot." Tx: orthotics (helps alittle), and OTC heel lift   77 y.o. female returns for the above complaint.   Objective:  There were no vitals filed for this visit. General AA&O x3. Normal mood and affect.  Vascular Pedal pulses palpable.  Neurologic Epicritic sensation grossly intact.  Dermatologic No open lesions. Skin normal texture and turgor.  Orthopedic: POP L 2nd MPJ   Assessment & Plan:  Patient was evaluated and treated and all questions answered.  Capsulitis 2nd MPJ -Injection as below.  Procedure: Joint Injection Location: Left 2nd MPJ joint Skin Prep: Alcohol. Injectate: 0.5 cc 1% lidocaine plain, 0.5 cc dexamethasone phosphate. Disposition: Patient tolerated procedure well. Injection site dressed with a band-aid.  Return in about 3 weeks (around 11/18/2017) for Arthritis, Capsulitis, Right.

## 2017-11-06 DIAGNOSIS — N390 Urinary tract infection, site not specified: Secondary | ICD-10-CM | POA: Diagnosis not present

## 2017-11-12 ENCOUNTER — Ambulatory Visit (INDEPENDENT_AMBULATORY_CARE_PROVIDER_SITE_OTHER): Payer: Medicare Other | Admitting: Podiatry

## 2017-11-12 DIAGNOSIS — M2011 Hallux valgus (acquired), right foot: Secondary | ICD-10-CM | POA: Diagnosis not present

## 2017-11-12 DIAGNOSIS — M2041 Other hammer toe(s) (acquired), right foot: Secondary | ICD-10-CM

## 2017-11-12 DIAGNOSIS — M779 Enthesopathy, unspecified: Secondary | ICD-10-CM | POA: Diagnosis not present

## 2017-11-12 NOTE — Patient Instructions (Signed)
Pre-Operative Instructions  Congratulations, you have decided to take an important step towards improving your quality of life.  You can be assured that the doctors and staff at [facility] will be with you every step of the way.  Here are some important things you should know:  1. Plan to be at the surgery center/hospital at least 1 (one) hour prior to your scheduled time, unless otherwise directed by the surgical center/hospital staff.  You must have a responsible adult accompany you, remain during the surgery and drive you home.  Make sure you have directions to the surgical center/hospital to ensure you arrive on time. 2. If you are having surgery at [facility] or [facility] hospitals, you will need a copy of your medical history and physical form from your family physician within one month prior to the date of surgery. We will give you a form for your primary physician to complete.  3. We make every effort to accommodate the date you request for surgery.  However, there are times where surgery dates or times have to be moved.  We will contact you as soon as possible if a change in schedule is required.   4. No aspirin/ibuprofen for one week before surgery.  If you are on aspirin, any non-steroidal anti-inflammatory medications (Mobic, Aleve, Ibuprofen) should not be taken seven (7) days prior to your surgery.  You make take Tylenol for pain prior to surgery.  5. Medications - If you are taking daily heart and blood pressure medications, seizure, reflux, allergy, asthma, anxiety, pain or diabetes medications, make sure you notify the surgery center/hospital before the day of surgery so they can tell you which medications you should take or avoid the day of surgery. 6. No food or drink after midnight the night before surgery unless directed otherwise by surgical center/hospital staff. 7. No alcoholic beverages 24-hours prior to surgery.  No smoking 24-hours prior or 24-hours after  surgery. 8. Wear loose pants or shorts. They should be loose enough to fit over bandages, boots, and casts. 9. Don't wear slip-on shoes. Sneakers are preferred. 10. Bring your boot with you to the surgery center/hospital.  Also bring crutches or a walker if your physician has prescribed it for you.  If you do not have this equipment, it will be provided for you after surgery. 11. If you have not been contacted by the surgery center/hospital by the day before your surgery, call to confirm the date and time of your surgery. 12. Leave-time from work may vary depending on the type of surgery you have.  Appropriate arrangements should be made prior to surgery with your employer. 13. Prescriptions will be provided immediately following surgery by your doctor.  Fill these as soon as possible after surgery and take the medication as directed. Pain medications will not be refilled on weekends and must be approved by the doctor. 14. Remove nail polish on the operative foot and avoid getting pedicures prior to surgery. 15. Wash the night before surgery.  The night before surgery wash the foot and leg well with water and the antibacterial soap provided. Be sure to pay special attention to beneath the toenails and in between the toes.  Wash for at least three (3) minutes. Rinse thoroughly with water and dry well with a towel.  Perform this wash unless told not to do so by your physician.  Enclosed: 1 Ice pack (please put in freezer the night before surgery)   1 Hibiclens skin cleaner     Pre-op instructions  If you have any questions regarding the instructions, please do not hesitate to call our office.  [place]: [address], [place], [place] [address] -- [phone]  [place]: [address] [place], [place] [address] -- [phone]  [place]: [address]  [place], [place] [address] -- [phone]  [place]: [address], [address], [place], [place] [address] -- [phone]  Website:  [email or web address] 

## 2017-11-13 ENCOUNTER — Telehealth: Payer: Self-pay | Admitting: *Deleted

## 2017-11-13 NOTE — Telephone Encounter (Signed)
"  I called yesterday.  I want to schedule my surgery with Dr. March Rummage."  Did he tell you which location you would have the surgery?"  Yes, he said it would be done in [place]."  Do you have a date in mind?  "I'd like to do it as soon as possible, maybe July 24 or 31."  He can do it on July 24.  Someone from the surgical center will call you a day or two prior to your surgery date.  They will give you your arrival time.  Please go on-line and register with the surgery center, instructions are in the brochure that was given to you.  "Okay, that's all I have to do?  That's simple enough."

## 2017-11-18 ENCOUNTER — Telehealth: Payer: Self-pay | Admitting: *Deleted

## 2017-11-18 NOTE — Telephone Encounter (Signed)
"  I have to cancel my surgery scheduled for July 24 with Dr. March Rummage.  I have a very important UroGynecology appointment coming up that week.  I can't miss it."  Do you have a date in mind?  "I can do it anytime after that date."  Dr. March Rummage can do it on August 7.  "Okay, that date will be fine."  I will get it rescheduled.  "They will call and let me know the time correct?"  Yes, they will call you a day or two prior to your surgery date.

## 2017-12-04 ENCOUNTER — Telehealth: Payer: Self-pay | Admitting: *Deleted

## 2017-12-04 DIAGNOSIS — N8111 Cystocele, midline: Secondary | ICD-10-CM | POA: Diagnosis not present

## 2017-12-04 DIAGNOSIS — N390 Urinary tract infection, site not specified: Secondary | ICD-10-CM | POA: Diagnosis not present

## 2017-12-04 DIAGNOSIS — N3946 Mixed incontinence: Secondary | ICD-10-CM | POA: Diagnosis not present

## 2017-12-04 DIAGNOSIS — R351 Nocturia: Secondary | ICD-10-CM | POA: Diagnosis not present

## 2017-12-04 NOTE — Telephone Encounter (Signed)
"  I'm scheduled for surgery on August 7, with Dr. March Rummage.  Is it okay for me to have some teeth extracted a week before my surgery?"  I'll have to ask Dr. March Rummage and call you back with a response.  He's in surgery today but will be here tomorrow.  I'll call you tomorrow with a response.

## 2017-12-04 NOTE — Telephone Encounter (Signed)
That should be fine. 

## 2017-12-04 NOTE — Progress Notes (Signed)
  Subjective:  Patient ID: Jill Fletcher, female    DOB: 11-Nov-1940,  MRN: [id number]  Chief Complaint  Patient presents with  . Foot Pain    F/U  R foot pain Pt. stated," Pain is about the same; 5/10 sharp (intermittent) pain." -swelling has decreased Tx: none   77 y.o. female returns for the above complaint. Pain about the same but swelling has decreased.  Objective:  There were no vitals filed for this visit. General AA&O x3. Normal mood and affect.  Vascular Pedal pulses palpable.  Neurologic Epicritic sensation grossly intact.  Dermatologic No open lesions. Skin normal texture and turgor.  Orthopedic: Hallux interphalangeus deformity, hammertoe 2nd toe, POP 2nd MPJ, 2nd hammertoe   Assessment & Plan:  Patient was evaluated and treated and all questions answered.  Hallux Interphalangeus, Hammertoe Capsulitis -Discussed possible surgical correction with patient. Will plan for right Akin osteotomy, 2nd hammertoe correction with possible pin fixation, possible 2nd/3rd shortening osteotomies, possible 2nd plantar plate repair, possible tenotomy and capsulotomy 2nd MPJ. All r/b/a explained to patient. No guarantees given.  No follow-ups on file.

## 2017-12-05 NOTE — Telephone Encounter (Signed)
I am returning your call to give you Dr. Eleanora Neighbor response to your question.  He said it would be fine for you to have your tooth extraction the week prior to your surgery.  "Okay great, I was a little worried about that.  So I am still on for August 7?"  Yes, you are okay to have the surgery as scheduled.

## 2017-12-11 ENCOUNTER — Other Ambulatory Visit: Payer: Self-pay

## 2017-12-13 ENCOUNTER — Telehealth: Payer: Self-pay | Admitting: Podiatry

## 2017-12-13 NOTE — Telephone Encounter (Signed)
I called the Jill Fletcher to let her know that Dr. March Rummage said she would get an IV of antibiotics before surgery at the surgical center and then she would get a week's worth of antibiotic pills to take after surgery. Jill Fletcher stated due to her allergies she can really only take Cipro or sulfa for an antibiotic. I told the Jill Fletcher to call our office if she had any other questions or concerns.

## 2017-12-18 ENCOUNTER — Encounter: Payer: Self-pay | Admitting: Podiatry

## 2017-12-18 DIAGNOSIS — M2021 Hallux rigidus, right foot: Secondary | ICD-10-CM | POA: Diagnosis not present

## 2017-12-18 DIAGNOSIS — M205X1 Other deformities of toe(s) (acquired), right foot: Secondary | ICD-10-CM | POA: Diagnosis not present

## 2017-12-18 DIAGNOSIS — M2041 Other hammer toe(s) (acquired), right foot: Secondary | ICD-10-CM | POA: Diagnosis not present

## 2017-12-18 DIAGNOSIS — E7800 Pure hypercholesterolemia, unspecified: Secondary | ICD-10-CM | POA: Diagnosis not present

## 2017-12-18 DIAGNOSIS — M2011 Hallux valgus (acquired), right foot: Secondary | ICD-10-CM | POA: Diagnosis not present

## 2017-12-18 DIAGNOSIS — M7741 Metatarsalgia, right foot: Secondary | ICD-10-CM | POA: Diagnosis not present

## 2017-12-18 DIAGNOSIS — M25571 Pain in right ankle and joints of right foot: Secondary | ICD-10-CM | POA: Diagnosis not present

## 2017-12-18 MED ORDER — KETOROLAC TROMETHAMINE 10 MG PO TABS: 10.0000 mg | ORAL_TABLET | Freq: Four times a day (QID) | ORAL | 0 refills | Status: DC | PRN

## 2017-12-18 MED ORDER — TRAMADOL HCL 50 MG PO TABS: 50.0000 mg | ORAL_TABLET | ORAL | 0 refills | Status: DC | PRN

## 2017-12-18 MED ORDER — CEPHALEXIN 500 MG PO CAPS: 500.0000 mg | ORAL_CAPSULE | Freq: Two times a day (BID) | ORAL | 0 refills | Status: DC

## 2017-12-19 DIAGNOSIS — M2021 Hallux rigidus, right foot: Secondary | ICD-10-CM

## 2017-12-19 DIAGNOSIS — M2011 Hallux valgus (acquired), right foot: Secondary | ICD-10-CM | POA: Diagnosis not present

## 2017-12-19 DIAGNOSIS — M2041 Other hammer toe(s) (acquired), right foot: Secondary | ICD-10-CM | POA: Diagnosis not present

## 2017-12-19 DIAGNOSIS — M21541 Acquired clubfoot, right foot: Secondary | ICD-10-CM

## 2017-12-19 NOTE — Progress Notes (Signed)
Patient had outpatient surgery at [facility].   Procedures Included: R Hallux Akin Osteotomy R Hallux Rigidus Repair with Capsular Release and Drilling of OCD R 2nd Metatarsal Osteotomy R 3rd Metatarsal Osteotomy R 2nd Hammertoe Correction

## 2017-12-24 ENCOUNTER — Ambulatory Visit (INDEPENDENT_AMBULATORY_CARE_PROVIDER_SITE_OTHER): Payer: Medicare Other

## 2017-12-24 ENCOUNTER — Ambulatory Visit (INDEPENDENT_AMBULATORY_CARE_PROVIDER_SITE_OTHER): Payer: Medicare Other | Admitting: Podiatry

## 2017-12-24 DIAGNOSIS — M2041 Other hammer toe(s) (acquired), right foot: Secondary | ICD-10-CM

## 2017-12-24 DIAGNOSIS — M2021 Hallux rigidus, right foot: Secondary | ICD-10-CM

## 2017-12-24 DIAGNOSIS — M2011 Hallux valgus (acquired), right foot: Secondary | ICD-10-CM

## 2017-12-24 DIAGNOSIS — Z9889 Other specified postprocedural states: Secondary | ICD-10-CM | POA: Diagnosis not present

## 2017-12-24 DIAGNOSIS — M21961 Unspecified acquired deformity of right lower leg: Secondary | ICD-10-CM

## 2017-12-31 ENCOUNTER — Ambulatory Visit: Payer: Medicare Other

## 2017-12-31 ENCOUNTER — Encounter: Payer: Self-pay | Admitting: Podiatry

## 2017-12-31 ENCOUNTER — Encounter: Payer: Medicare Other | Admitting: Podiatry

## 2017-12-31 ENCOUNTER — Ambulatory Visit (INDEPENDENT_AMBULATORY_CARE_PROVIDER_SITE_OTHER): Payer: Medicare Other | Admitting: Podiatry

## 2017-12-31 VITALS — BP 133/81 | HR 84 | Resp 15

## 2017-12-31 DIAGNOSIS — M2021 Hallux rigidus, right foot: Secondary | ICD-10-CM | POA: Diagnosis not present

## 2017-12-31 DIAGNOSIS — M2041 Other hammer toe(s) (acquired), right foot: Secondary | ICD-10-CM

## 2017-12-31 DIAGNOSIS — M2011 Hallux valgus (acquired), right foot: Secondary | ICD-10-CM

## 2017-12-31 DIAGNOSIS — Z9889 Other specified postprocedural states: Secondary | ICD-10-CM

## 2017-12-31 DIAGNOSIS — M21961 Unspecified acquired deformity of right lower leg: Secondary | ICD-10-CM | POA: Diagnosis not present

## 2017-12-31 NOTE — Progress Notes (Signed)
  Subjective:  Patient ID: Jill Fletcher, female    DOB: 13-Apr-1941,  MRN: [id number]  Chief Complaint  Patient presents with  . Post-op Problem    Pt. stated," I get flashes of pain on thetop of my foot, otherwise it's okay." Tx:  -pt denies N/V/F/ch    DOS: 12/24/17 Procedure: Right foot hallux Rigidus Correction with Capsular Release, Akin osteotomy, Weil osteotomies 2-3, hammertoe correction 2nd.  76 y.o. female returns for post-op check. Denies N/V/F/Ch. Has been doing better since last visit. Not having the electric flashes that she was experiencing previously.  Objective:   General AA&O x3. Normal mood and affect.  Vascular Foot warm and well perfused.  Neurologic Gross sensation intact.  Dermatologic Skin healing well without signs of infection. Skin edges well coapted without signs of infection.  Orthopedic: Tenderness to palpation noted about the surgical site.    Assessment & Plan:  Patient was evaluated and treated and all questions answered.  S/p right foot surgery. -Progressing as expected post-operatively. -Bruising and swelling improved today. -Sutures: Intact. -Medications refilled: None -Foot redressed.  Follow-up in 1 week for recheck

## 2017-12-31 NOTE — Progress Notes (Signed)
  Subjective:  Patient ID: Jill Fletcher, female    DOB: 1940-10-04,  MRN: [id number]  No chief complaint on file.   DOS: 12/24/17 Procedure: Right foot hallux Rigidus Correction with Capsular Release, Akin osteotomy, Weil osteotomies 2-3, hammertoe correction 2nd.  77 y.o. female returns for post-op check. Denies N/V/F/Ch.  Has not had to take any pain medication post procedure.  Only reports sharp electric type pain for the past 2 days.  Objective:   General AA&O x3. Normal mood and affect.  Vascular Foot warm and well perfused.  Neurologic Gross sensation intact.  Dermatologic Skin healing well without signs of infection. Skin edges well coapted without signs of infection.  Orthopedic: Tenderness to palpation noted about the surgical site.    Assessment & Plan:  Patient was evaluated and treated and all questions answered.  S/p right foot surgery -X-rays taken reviewed consistent with postop state. -Progressing as expected post-operatively. -Sutures: Intact. -Medications refilled: None -Foot redressed.  Follow-up in 1 week for recheck

## 2018-01-08 DIAGNOSIS — N3000 Acute cystitis without hematuria: Secondary | ICD-10-CM | POA: Diagnosis not present

## 2018-01-14 ENCOUNTER — Ambulatory Visit (INDEPENDENT_AMBULATORY_CARE_PROVIDER_SITE_OTHER): Payer: Medicare Other

## 2018-01-14 ENCOUNTER — Encounter: Payer: Self-pay | Admitting: Podiatry

## 2018-01-14 ENCOUNTER — Ambulatory Visit (INDEPENDENT_AMBULATORY_CARE_PROVIDER_SITE_OTHER): Payer: Medicare Other | Admitting: Podiatry

## 2018-01-14 DIAGNOSIS — M2041 Other hammer toe(s) (acquired), right foot: Secondary | ICD-10-CM

## 2018-01-14 NOTE — Progress Notes (Signed)
  Subjective:  Patient ID: Jill Fletcher, female    DOB: 09-12-1940,  MRN: [id number]  Chief Complaint  Patient presents with  . Routine Post Op    pov#2 dos 08.07.2019 Aiken Osteotomy Rt, Metatarsal Osteotomy 2,3 Rt, Capsulotomy, MPJ Release Joint 2,3 Rt, Hammertoe Repair 2nd Rt; pt stated, "doing good, little pain and discomfort, no new concerns"    DOS: 12/18/17 Procedure: Right foot hallux Rigidus Correction with Capsular Release, Akin osteotomy, Weil osteotomies 2-3, hammertoe correction 2nd.  77 y.o. female returns for post-op check. Denies N/V/F/Ch. Doing well. Not having as bad sharp pains. Little discomfort but otherwise feeling well.  Objective:   General AA&O x3. Normal mood and affect.  Vascular Foot warm and well perfused.  Neurologic Gross sensation intact.  Dermatologic Skin healing well without signs of infection. Skin edges well coapted without signs of infection.  Orthopedic: Tenderness to palpation noted about the surgical site. Pin intact without pin tract infection.    Assessment & Plan:  Patient was evaluated and treated and all questions answered.  S/p right foot surgery. -Progressing as expected post-operatively. -XR taken and reviewed. Hardware intact. -Bruising and swelling improved today. -Sutures: Removed today. -Toe alignment splint dispensed. -Pin pulled today. -Medications refilled: None -Foot redressed.  Follow-up in 2 weeks for XR, progression to surgical shoe.

## 2018-01-27 DIAGNOSIS — R6889 Other general symptoms and signs: Secondary | ICD-10-CM | POA: Diagnosis not present

## 2018-01-27 DIAGNOSIS — R5383 Other fatigue: Secondary | ICD-10-CM | POA: Diagnosis not present

## 2018-01-27 DIAGNOSIS — R031 Nonspecific low blood-pressure reading: Secondary | ICD-10-CM | POA: Diagnosis not present

## 2018-01-28 ENCOUNTER — Ambulatory Visit (INDEPENDENT_AMBULATORY_CARE_PROVIDER_SITE_OTHER): Payer: Medicare Other | Admitting: Podiatry

## 2018-01-28 ENCOUNTER — Ambulatory Visit (INDEPENDENT_AMBULATORY_CARE_PROVIDER_SITE_OTHER): Payer: Medicare Other

## 2018-01-28 DIAGNOSIS — Z9889 Other specified postprocedural states: Secondary | ICD-10-CM

## 2018-01-28 DIAGNOSIS — R031 Nonspecific low blood-pressure reading: Secondary | ICD-10-CM | POA: Diagnosis not present

## 2018-01-28 DIAGNOSIS — M2021 Hallux rigidus, right foot: Secondary | ICD-10-CM

## 2018-01-28 DIAGNOSIS — M2041 Other hammer toe(s) (acquired), right foot: Secondary | ICD-10-CM | POA: Diagnosis not present

## 2018-01-28 DIAGNOSIS — M21961 Unspecified acquired deformity of right lower leg: Secondary | ICD-10-CM | POA: Diagnosis not present

## 2018-01-28 DIAGNOSIS — R5383 Other fatigue: Secondary | ICD-10-CM | POA: Diagnosis not present

## 2018-01-28 DIAGNOSIS — M2011 Hallux valgus (acquired), right foot: Secondary | ICD-10-CM

## 2018-01-28 DIAGNOSIS — R6889 Other general symptoms and signs: Secondary | ICD-10-CM | POA: Diagnosis not present

## 2018-01-28 NOTE — Progress Notes (Signed)
  Subjective:  Patient ID: Jill Fletcher, female    DOB: 05-24-1940,  MRN: [id number]  No chief complaint on file.   DOS: 12/18/17 Procedure: Right foot hallux Rigidus Correction with Capsular Release, Akin osteotomy, Weil osteotomies 2-3, hammertoe correction 2nd.  77 y.o. female returns for post-op check. Denies N/V/F/Ch. Doing well. Denies pain. Taking some steps at home without her boot on and not having any pain.  Objective:   General AA&O x3. Normal mood and affect.  Vascular Foot warm and well perfused.  Neurologic Gross sensation intact.  Dermatologic Skin well healed. Small residual suture removed. Xerosis of skin noted.  Orthopedic: No tenderness to palpation noted about the surgical site. R hallux rectus. 2nd toe slightly elevated but rectus.    Assessment & Plan:  Patient was evaluated and treated and all questions answered.  S/p right foot surgery. -Progressing as expected post-operatively. -Sutures: remaining suture removed today. -Continue toe alignment splint. -Medications refilled: None -Surgical shoe dispensed. WBAT in shoe x2 weeks, transition thereafter to shoegear of choice.  Return in about 4 weeks (around 02/25/2018) for Post-op check for final XRs.

## 2018-01-30 DIAGNOSIS — N3001 Acute cystitis with hematuria: Secondary | ICD-10-CM | POA: Diagnosis not present

## 2018-01-30 DIAGNOSIS — N3000 Acute cystitis without hematuria: Secondary | ICD-10-CM | POA: Diagnosis not present

## 2018-01-31 ENCOUNTER — Telehealth: Payer: Self-pay | Admitting: Podiatry

## 2018-01-31 NOTE — Telephone Encounter (Signed)
I asked pt if the surgery shoe rubbed her toenail and pt states the discomfort is where the toe bends at the foot not the toenail. I told pt that there are accessory tendons in the leg that assist in the movement and lifting of the foot and they may need a more gradual transfer to the surgical shoe to acclimate to their duties again. I told pt to wear the surgical shoe as long as it was comfortable and once uncomfortable switch back to the surgery boot, this would make the transfer gradual. I also told pt to wear the boot outside of the house for protection and comfort. Pt states understanding.

## 2018-01-31 NOTE — Telephone Encounter (Signed)
Pt called and stated that she is having great toenail pain when she switch her surgical boot to a smaller boot. She wanted to know are these the symptoms because her next appointment is not until October

## 2018-02-05 ENCOUNTER — Telehealth: Payer: Self-pay | Admitting: Podiatry

## 2018-02-05 NOTE — Telephone Encounter (Signed)
I'm having a very severe swollen and painful foot. I've spoken to a nurse before and I need someone to call me and tell me what to do to stop this swelling and pain. My number is [phone]. Thank you.

## 2018-02-05 NOTE — Telephone Encounter (Signed)
I called pt, she states she is having more pain and swelling, even more than after the surgery and she can only walk a short distance without pain. Pt denies fever, redness or drainage. I told pt that I felt she would benefit from being seen earlier than 02/2018, to continue rest, ice and elevation and I transferred to schedulers to get in to be seen by Dr. March Rummage this week.

## 2018-02-07 ENCOUNTER — Ambulatory Visit (INDEPENDENT_AMBULATORY_CARE_PROVIDER_SITE_OTHER): Payer: Medicare Other | Admitting: Podiatry

## 2018-02-07 ENCOUNTER — Ambulatory Visit (INDEPENDENT_AMBULATORY_CARE_PROVIDER_SITE_OTHER): Payer: Medicare Other

## 2018-02-07 DIAGNOSIS — M21961 Unspecified acquired deformity of right lower leg: Secondary | ICD-10-CM

## 2018-02-07 DIAGNOSIS — Z9889 Other specified postprocedural states: Secondary | ICD-10-CM

## 2018-02-07 MED ORDER — CEPHALEXIN 500 MG PO CAPS: 500.0000 mg | ORAL_CAPSULE | Freq: Two times a day (BID) | ORAL | 0 refills | Status: DC

## 2018-02-07 NOTE — Progress Notes (Signed)
  Subjective:  Patient ID: Jill Fletcher, female    DOB: 08/18/1940,  MRN: [id number]  Chief Complaint  Patient presents with  . Post-op Problem    pt having increased swelling and pain in surgical foot /right foot xray    DOS: 12/18/17 Procedure: Right foot hallux Rigidus Correction with Capsular Release, Akin osteotomy, Weil osteotomies 2-3, hammertoe correction 2nd.  77 y.o. female returns for post-op check. Denies N/V/F/Ch.  Reports increased swelling and pain to the right foot.  Has been walking in her surgical shoe as directed.  Denies calf pain.  Objective:   General AA&O x3. Normal mood and affect.  Vascular Foot warm and well perfused.  Neurologic Gross sensation intact.  Dermatologic Skin well healed. Slight local warmth and erythema R 1st MPJ  Orthopedic: Tenderness to palpation noted about the surgical site. R hallux rectus. 2nd toe slightly elevated but rectus.    Assessment & Plan:  Patient was evaluated and treated and all questions answered.  S/p right foot surgery. -X-rays taken reviewed.  Osteotomy is healed with intact hardware. -Worsening pain and swelling today.  Rx Keflex as prophylaxis -Continue surgical shoe. -Medications refilled: None -We will follow Monday plan for possible transition to surgical shoe  No follow-ups on file.

## 2018-02-10 ENCOUNTER — Ambulatory Visit (INDEPENDENT_AMBULATORY_CARE_PROVIDER_SITE_OTHER): Payer: Medicare Other | Admitting: Podiatry

## 2018-02-10 ENCOUNTER — Encounter: Payer: Self-pay | Admitting: Podiatry

## 2018-02-10 VITALS — BP 119/64 | HR 76 | Temp 97.1°F | Resp 16

## 2018-02-10 DIAGNOSIS — Z9889 Other specified postprocedural states: Secondary | ICD-10-CM

## 2018-02-10 DIAGNOSIS — M21961 Unspecified acquired deformity of right lower leg: Secondary | ICD-10-CM | POA: Diagnosis not present

## 2018-02-10 DIAGNOSIS — M2041 Other hammer toe(s) (acquired), right foot: Secondary | ICD-10-CM | POA: Diagnosis not present

## 2018-02-10 DIAGNOSIS — R600 Localized edema: Secondary | ICD-10-CM

## 2018-02-10 NOTE — Progress Notes (Signed)
  Subjective:  Patient ID: Jill Fletcher, female    DOB: 1940-11-23,  MRN: [id number]  Chief Complaint  Patient presents with  . Routine Post Op    Pt. stated," Keflex is not helping at all. The swelling and pain are about the same ( 7/10 sharp pain)." Tx: Keflex, surgical shoe, and elevation -pt denies N/V/F/Ch    DOS: 12/18/17 Procedure: Right foot hallux Rigidus Correction with Capsular Release, Akin osteotomy, Weil osteotomies 2-3, hammertoe correction 2nd.  77 y.o. female returns for post-op check. Denies N/V/F/Ch.  Endorses continued swelling.  States it is worse when she is wearing the shoe not as bad when she walks barefoot.  Swelling is worse with continued activity.  Taking Keflex as directed  Objective:   General AA&O x3. Normal mood and affect.  Vascular Foot warm and well perfused.  Neurologic Gross sensation intact.  Dermatologic Skin well healed. Slight local warmth and erythema R 1st MPJ  Orthopedic: Tenderness to palpation noted about the surgical site. R hallux rectus. 2nd toe slightly elevated but rectus.    Assessment & Plan:  Patient was evaluated and treated and all questions answered.  S/p right foot surgery. -Swelling improved but still with some continued swelling. -Unna boot and compressive dressing placed today for swelling. -Continue surgical shoe.  She can safely transition her shoe as tolerated -Medications refilled: None  Return in about 1 week (around 02/17/2018) for Post-op, Right.

## 2018-02-17 ENCOUNTER — Encounter: Payer: Self-pay | Admitting: Podiatry

## 2018-02-17 ENCOUNTER — Ambulatory Visit (INDEPENDENT_AMBULATORY_CARE_PROVIDER_SITE_OTHER): Payer: Medicare Other | Admitting: Podiatry

## 2018-02-17 VITALS — BP 120/72 | HR 81 | Temp 96.5°F | Resp 16

## 2018-02-17 DIAGNOSIS — Z9889 Other specified postprocedural states: Secondary | ICD-10-CM

## 2018-02-17 DIAGNOSIS — R600 Localized edema: Secondary | ICD-10-CM

## 2018-02-17 NOTE — Progress Notes (Signed)
  Subjective:  Patient ID: Jill Fletcher, female    DOB: [birth year],  MRN: [id number]  Chief Complaint  Patient presents with  . Routine Post Op    Pt. stated," I took off the unna boot that same night it was applied, my foot got very swollen and painful. Now it's a littl eless swollen and w/ intermittent pain." Tx: surgical shoes -pt denies N/V/F/Ch    DOS: 12/18/17 Procedure: Right foot hallux Rigidus Correction with Capsular Release, Akin osteotomy, Weil osteotomies 2-3, hammertoe correction 2nd.  77 y.o. female returns for post-op check. Denies N/V/F/Ch.  States that the area still swollen but is a little less than previously.  Having intermittent pain.  States that her foot is too swollen to get into a normal sneaker.  Objective:   General AA&O x3. Normal mood and affect.  Vascular Foot warm and well perfused.  Neurologic Gross sensation intact.  Dermatologic Skin well healed.  No warmth erythema signs of infection  Orthopedic:  No tenderness to palpation noted about the surgical site. R hallux rectus. 2nd toe slightly elevated but rectus.   Assessment & Plan:  Patient was evaluated and treated and all questions answered.  S/p right foot surgery. -Overall doing well status post foot surgery with residual swelling. -Tubigrip dispensed for swelling. -Advised to transition to normal shoe -Refer to PT for strengthening and edema reduction. -Medications refilled: None  Return in about 4 weeks (around 03/17/2018) for Post-op.

## 2018-02-19 DIAGNOSIS — R262 Difficulty in walking, not elsewhere classified: Secondary | ICD-10-CM | POA: Diagnosis not present

## 2018-02-19 DIAGNOSIS — M25571 Pain in right ankle and joints of right foot: Secondary | ICD-10-CM | POA: Diagnosis not present

## 2018-02-20 DIAGNOSIS — N390 Urinary tract infection, site not specified: Secondary | ICD-10-CM | POA: Diagnosis not present

## 2018-02-20 DIAGNOSIS — Z6825 Body mass index (BMI) 25.0-25.9, adult: Secondary | ICD-10-CM | POA: Diagnosis not present

## 2018-02-20 DIAGNOSIS — I10 Essential (primary) hypertension: Secondary | ICD-10-CM | POA: Diagnosis not present

## 2018-02-20 DIAGNOSIS — E039 Hypothyroidism, unspecified: Secondary | ICD-10-CM | POA: Diagnosis not present

## 2018-02-20 DIAGNOSIS — Z87440 Personal history of urinary (tract) infections: Secondary | ICD-10-CM | POA: Diagnosis not present

## 2018-02-20 DIAGNOSIS — R7989 Other specified abnormal findings of blood chemistry: Secondary | ICD-10-CM | POA: Diagnosis not present

## 2018-02-20 DIAGNOSIS — K219 Gastro-esophageal reflux disease without esophagitis: Secondary | ICD-10-CM | POA: Diagnosis not present

## 2018-02-24 DIAGNOSIS — R262 Difficulty in walking, not elsewhere classified: Secondary | ICD-10-CM | POA: Diagnosis not present

## 2018-02-24 DIAGNOSIS — M25571 Pain in right ankle and joints of right foot: Secondary | ICD-10-CM | POA: Diagnosis not present

## 2018-02-25 ENCOUNTER — Encounter: Payer: Medicare Other | Admitting: Podiatry

## 2018-02-26 DIAGNOSIS — M25571 Pain in right ankle and joints of right foot: Secondary | ICD-10-CM | POA: Diagnosis not present

## 2018-02-26 DIAGNOSIS — R262 Difficulty in walking, not elsewhere classified: Secondary | ICD-10-CM | POA: Diagnosis not present

## 2018-02-27 DIAGNOSIS — N390 Urinary tract infection, site not specified: Secondary | ICD-10-CM | POA: Diagnosis not present

## 2018-03-03 DIAGNOSIS — M25571 Pain in right ankle and joints of right foot: Secondary | ICD-10-CM | POA: Diagnosis not present

## 2018-03-03 DIAGNOSIS — R262 Difficulty in walking, not elsewhere classified: Secondary | ICD-10-CM | POA: Diagnosis not present

## 2018-03-06 DIAGNOSIS — R262 Difficulty in walking, not elsewhere classified: Secondary | ICD-10-CM | POA: Diagnosis not present

## 2018-03-06 DIAGNOSIS — M25571 Pain in right ankle and joints of right foot: Secondary | ICD-10-CM | POA: Diagnosis not present

## 2018-03-07 DIAGNOSIS — D631 Anemia in chronic kidney disease: Secondary | ICD-10-CM | POA: Diagnosis not present

## 2018-03-07 DIAGNOSIS — N183 Chronic kidney disease, stage 3 (moderate): Secondary | ICD-10-CM | POA: Diagnosis not present

## 2018-03-07 DIAGNOSIS — I129 Hypertensive chronic kidney disease with stage 1 through stage 4 chronic kidney disease, or unspecified chronic kidney disease: Secondary | ICD-10-CM | POA: Diagnosis not present

## 2018-03-07 DIAGNOSIS — N2581 Secondary hyperparathyroidism of renal origin: Secondary | ICD-10-CM | POA: Diagnosis not present

## 2018-03-07 DIAGNOSIS — N390 Urinary tract infection, site not specified: Secondary | ICD-10-CM | POA: Diagnosis not present

## 2018-03-07 DIAGNOSIS — N189 Chronic kidney disease, unspecified: Secondary | ICD-10-CM | POA: Diagnosis not present

## 2018-03-07 DIAGNOSIS — E785 Hyperlipidemia, unspecified: Secondary | ICD-10-CM | POA: Diagnosis not present

## 2018-03-07 DIAGNOSIS — K743 Primary biliary cirrhosis: Secondary | ICD-10-CM | POA: Diagnosis not present

## 2018-03-10 DIAGNOSIS — M25571 Pain in right ankle and joints of right foot: Secondary | ICD-10-CM | POA: Diagnosis not present

## 2018-03-10 DIAGNOSIS — R262 Difficulty in walking, not elsewhere classified: Secondary | ICD-10-CM | POA: Diagnosis not present

## 2018-03-11 ENCOUNTER — Other Ambulatory Visit: Payer: Self-pay | Admitting: Nephrology

## 2018-03-11 DIAGNOSIS — N390 Urinary tract infection, site not specified: Secondary | ICD-10-CM

## 2018-03-11 DIAGNOSIS — E785 Hyperlipidemia, unspecified: Secondary | ICD-10-CM

## 2018-03-11 DIAGNOSIS — N183 Chronic kidney disease, stage 3 unspecified: Secondary | ICD-10-CM

## 2018-03-11 DIAGNOSIS — D631 Anemia in chronic kidney disease: Secondary | ICD-10-CM

## 2018-03-11 DIAGNOSIS — N189 Chronic kidney disease, unspecified: Secondary | ICD-10-CM

## 2018-03-11 DIAGNOSIS — N2581 Secondary hyperparathyroidism of renal origin: Secondary | ICD-10-CM

## 2018-03-11 DIAGNOSIS — K743 Primary biliary cirrhosis: Secondary | ICD-10-CM

## 2018-03-11 DIAGNOSIS — I129 Hypertensive chronic kidney disease with stage 1 through stage 4 chronic kidney disease, or unspecified chronic kidney disease: Secondary | ICD-10-CM

## 2018-03-12 DIAGNOSIS — R262 Difficulty in walking, not elsewhere classified: Secondary | ICD-10-CM | POA: Diagnosis not present

## 2018-03-12 DIAGNOSIS — M25571 Pain in right ankle and joints of right foot: Secondary | ICD-10-CM | POA: Diagnosis not present

## 2018-03-13 ENCOUNTER — Ambulatory Visit
Admission: RE | Admit: 2018-03-13 | Discharge: 2018-03-13 | Disposition: A | Discharge status: Home / Self Care | Payer: Medicare Other | Source: Ambulatory Visit | Attending: Nephrology | Admitting: Nephrology

## 2018-03-13 DIAGNOSIS — N189 Chronic kidney disease, unspecified: Secondary | ICD-10-CM

## 2018-03-13 DIAGNOSIS — D631 Anemia in chronic kidney disease: Secondary | ICD-10-CM

## 2018-03-13 DIAGNOSIS — N183 Chronic kidney disease, stage 3 unspecified: Secondary | ICD-10-CM

## 2018-03-13 DIAGNOSIS — I129 Hypertensive chronic kidney disease with stage 1 through stage 4 chronic kidney disease, or unspecified chronic kidney disease: Secondary | ICD-10-CM

## 2018-03-13 DIAGNOSIS — N2581 Secondary hyperparathyroidism of renal origin: Secondary | ICD-10-CM

## 2018-03-13 DIAGNOSIS — E785 Hyperlipidemia, unspecified: Secondary | ICD-10-CM

## 2018-03-13 DIAGNOSIS — N390 Urinary tract infection, site not specified: Secondary | ICD-10-CM

## 2018-03-13 DIAGNOSIS — K743 Primary biliary cirrhosis: Secondary | ICD-10-CM

## 2018-03-14 DIAGNOSIS — N183 Chronic kidney disease, stage 3 (moderate): Secondary | ICD-10-CM | POA: Diagnosis not present

## 2018-03-14 DIAGNOSIS — Z6825 Body mass index (BMI) 25.0-25.9, adult: Secondary | ICD-10-CM | POA: Diagnosis not present

## 2018-03-14 DIAGNOSIS — R300 Dysuria: Secondary | ICD-10-CM | POA: Diagnosis not present

## 2018-03-17 ENCOUNTER — Ambulatory Visit (INDEPENDENT_AMBULATORY_CARE_PROVIDER_SITE_OTHER): Payer: Medicare Other | Admitting: Podiatry

## 2018-03-17 DIAGNOSIS — Z9889 Other specified postprocedural states: Secondary | ICD-10-CM

## 2018-03-17 NOTE — Progress Notes (Signed)
Patient ID: Jill Fletcher, female   DOB: September 01, 1940, 77 y.o.   MRN: [id number]   Patient presents stating orthotics are not fitting well.  Will send back to [facility] for adjustment and call to be refitted when they return.

## 2018-03-17 NOTE — Progress Notes (Signed)
  Subjective:  Patient ID: Jill Fletcher, female    DOB: 1940/12/17,  MRN: [id number]  No chief complaint on file.   DOS: 12/18/17 Procedure: Right foot hallux Rigidus Correction with Capsular Release, Akin osteotomy, Weil osteotomies 2-3, hammertoe correction 2nd.  77 y.o. female returns for post-op check. Denies N/V/F/Ch. Doing very well states she is not having any pain and is in her normal shoes without any concerns.   Objective:   General AA&O x3. Normal mood and affect.  Vascular Foot warm and well perfused.  Neurologic Gross sensation intact.  Dermatologic Skin well healed.  No warmth erythema signs of infection  Orthopedic:  No tenderness to palpation noted about the surgical site. R hallux rectus. 2nd toe slightly elevated but rectus.   Assessment & Plan:  Patient was evaluated and treated and all questions answered.  S/p right foot surgery. -Doing much better not having any pain. -Continue Normal shoegear. -Will discharge from PT. -Medications refilled: None  Return in about 2 weeks (around 03/31/2018).

## 2018-03-28 NOTE — Progress Notes (Signed)
Patient ID: Jill Fletcher, female   DOB: 03/25/1941, 77 y.o.   MRN: [id number]   Patient presents for orthotic pick up.  Verbal and written break in and wear instructions given.  Patient will follow up in 4 weeks with Dr March Rummage if symptoms worsen or fail to improve.

## 2018-04-02 ENCOUNTER — Other Ambulatory Visit: Payer: Self-pay

## 2018-04-04 DIAGNOSIS — N390 Urinary tract infection, site not specified: Secondary | ICD-10-CM | POA: Diagnosis not present

## 2018-04-08 DIAGNOSIS — N183 Chronic kidney disease, stage 3 (moderate): Secondary | ICD-10-CM | POA: Diagnosis not present

## 2018-04-16 DIAGNOSIS — D631 Anemia in chronic kidney disease: Secondary | ICD-10-CM | POA: Diagnosis not present

## 2018-04-16 DIAGNOSIS — N183 Chronic kidney disease, stage 3 (moderate): Secondary | ICD-10-CM | POA: Diagnosis not present

## 2018-04-16 DIAGNOSIS — I129 Hypertensive chronic kidney disease with stage 1 through stage 4 chronic kidney disease, or unspecified chronic kidney disease: Secondary | ICD-10-CM | POA: Diagnosis not present

## 2018-04-16 DIAGNOSIS — E785 Hyperlipidemia, unspecified: Secondary | ICD-10-CM | POA: Diagnosis not present

## 2018-04-16 DIAGNOSIS — K743 Primary biliary cirrhosis: Secondary | ICD-10-CM | POA: Diagnosis not present

## 2018-04-16 DIAGNOSIS — N390 Urinary tract infection, site not specified: Secondary | ICD-10-CM | POA: Diagnosis not present

## 2018-04-16 DIAGNOSIS — N2581 Secondary hyperparathyroidism of renal origin: Secondary | ICD-10-CM | POA: Diagnosis not present

## 2018-05-12 DIAGNOSIS — N390 Urinary tract infection, site not specified: Secondary | ICD-10-CM | POA: Diagnosis not present

## 2018-05-19 ENCOUNTER — Ambulatory Visit (INDEPENDENT_AMBULATORY_CARE_PROVIDER_SITE_OTHER): Payer: Medicare Other | Admitting: Podiatry

## 2018-05-19 ENCOUNTER — Other Ambulatory Visit: Payer: Self-pay

## 2018-05-19 ENCOUNTER — Encounter: Payer: Self-pay | Admitting: Podiatry

## 2018-05-19 VITALS — BP 125/69 | HR 69 | Resp 16

## 2018-05-19 DIAGNOSIS — M779 Enthesopathy, unspecified: Secondary | ICD-10-CM | POA: Diagnosis not present

## 2018-05-19 DIAGNOSIS — M79671 Pain in right foot: Secondary | ICD-10-CM | POA: Diagnosis not present

## 2018-05-19 NOTE — Progress Notes (Signed)
Subjective:  Patient ID: Jill Fletcher, female    DOB: 05/03/1941,  MRN: [id number]  Chief Complaint  Patient presents with  . Routine Post Op    Pt. states," still bothering, but it's fine; 3/10 pain. It's more the bony par on the inner side of my foot that boythers." Tx: none     78 y.o. female presents with the above complaint.    Review of Systems: Negative except as noted in the HPI. Denies N/V/F/Ch.  Past Medical History:  Diagnosis Date  . Fibromyalgia   . GERD (gastroesophageal reflux disease)   . Hypercholesteremia   . Hypertension   . Hypothyroidism   . Osteoarthritis of spine   . PONV (postoperative nausea and vomiting)    yrs ago, 08-03-2004 back xray on chart per dr Alvan Dame request  . Primary biliary cirrhosis ([place])     Current Outpatient Medications:  .  aspirin EC 325 MG tablet, Take 1 tablet (325 mg total) by mouth 2 (two) times daily., Disp: 60 tablet, Rfl: 0 .  B Complex-Biotin-FA (B-COMPLEX PO), Take 1 capsule by mouth daily. , Disp: , Rfl:  .  Calcium Carbonate-Vitamin D (CALCIUM + D PO), Take 1 tablet by mouth daily., Disp: , Rfl:  .  cephALEXin (KEFLEX) 500 MG capsule, Take 1 capsule (500 mg total) by mouth 2 (two) times daily., Disp: 14 capsule, Rfl: 0 .  Cholecalciferol (VITAMIN D3) 1000 UNITS CAPS, Take 1,000 Units by mouth daily., Disp: , Rfl:  .  clindamycin (CLEOCIN) 300 MG capsule, TK 1 C PO BID, Disp: , Rfl: 0 .  Coenzyme Q10 200 MG capsule, Take 200 mg by mouth daily., Disp: , Rfl:  .  Cranberry (SM CRANBERRY) 300 MG tablet, Take 300 mg by mouth daily., Disp: , Rfl:  .  docusate sodium 100 MG CAPS, Take 100 mg by mouth 2 (two) times daily., Disp: 10 capsule, Rfl:  .  estradiol (VIVELLE-DOT) 0.05 MG/24HR, Place 1 patch onto the skin once a week., Disp: , Rfl:  .  estradiol (VIVELLE-DOT) 0.1 MG/24HR patch, , Disp: , Rfl:  .  ferrous sulfate 325 (65 FE) MG tablet, Take 1 tablet (325 mg total) by mouth 3 (three) times daily after meals., Disp: , Rfl:    .  FIBER PO, Take 2 tablets by mouth 2 (two) times daily. , Disp: , Rfl:  .  fish oil-omega-3 fatty acids 1000 MG capsule, Take 1 g by mouth daily. , Disp: , Rfl:  .  ketorolac (TORADOL) 10 MG tablet, Take 1 tablet (10 mg total) by mouth every 6 (six) hours as needed., Disp: 12 tablet, Rfl: 0 .  levothyroxine (SYNTHROID, LEVOTHROID) 75 MCG tablet, Take 75 mcg by mouth daily before breakfast. , Disp: , Rfl:  .  Magnesium 250 MG TABS, Take 250 mg by mouth daily., Disp: , Rfl:  .  meloxicam (MOBIC) 7.5 MG tablet, Take 1 tablet (7.5 mg total) by mouth daily., Disp: 30 tablet, Rfl: 0 .  metoprolol succinate (TOPROL-XL) 50 MG 24 hr tablet, Take 50 mg by mouth daily before breakfast. Take with or immediately following a meal., Disp: , Rfl:  .  Milk Thistle 1000 MG CAPS, Take 1 capsule by mouth daily., Disp: , Rfl:  .  Multiple Vitamins-Minerals (MULTIVITAMIN PO), Take 1 tablet by mouth daily., Disp: , Rfl:  .  oxyCODONE (OXY IR/ROXICODONE) 5 MG immediate release tablet, Take 1-2 tablets (5-10 mg total) by mouth every 4 (four) hours as needed for pain., Disp: 120  tablet, Rfl: 0 .  pantoprazole (PROTONIX) 40 MG tablet, Take 40 mg by mouth every morning. , Disp: , Rfl:  .  polyethylene glycol (MIRALAX / GLYCOLAX) packet, Take 17 g by mouth 2 (two) times daily., Disp: 14 each, Rfl:  .  tiZANidine (ZANAFLEX) 4 MG capsule, Take 1 capsule (4 mg total) by mouth 3 (three) times daily. Muscle spasms, Disp: 50 capsule, Rfl: 0 .  traMADol (ULTRAM) 50 MG tablet, Take 1 tablet (50 mg total) by mouth every 4 (four) hours as needed., Disp: 30 tablet, Rfl: 0 .  ursodiol (ACTIGALL) 500 MG tablet, Take 500 mg by mouth 2 (two) times daily., Disp: , Rfl:  .  vitamin C (ASCORBIC ACID) 500 MG tablet, Take 500 mg by mouth daily., Disp: , Rfl:   Social History   Tobacco Use  Smoking Status Never Smoker  Smokeless Tobacco Never Used    Allergies  Allergen Reactions  . Tylenol [Acetaminophen]     Patient has Primary  Biliary Cirrhosis   . Penicillins Other (See Comments), Itching and Rash    Whelps at injections site and itching.  Patient states she tolerates Ceftin.    Whelps at injections site and itching. Patient states she tolerates Ceftin.   Rapid heartbeat Whelps at injections site and itching.Patient states she tolerates Ceftin.  Rapid heartbeat   . Levaquin [Levofloxacin] Other (See Comments)    Heart pounding and itching   . Other Itching    ALL MYCINS & CILLINS AND CYCLINS-CANT SLEEP AND HEART POUNDING  . Codeine Palpitations  . Doxycycline Rash    And like medications. Heart pappitations and itching And like medications. Heart pappitations and itching   Objective:   Vitals:   05/19/18 1000  BP: 125/69  Pulse: 69  Resp: 16   There is no height or weight on file to calculate BMI. Constitutional Well developed. Well nourished.  Vascular Dorsalis pedis pulses palpable bilaterally. Posterior tibial pulses palpable bilaterally. Capillary refill normal to all digits.  No cyanosis or clubbing noted. Pedal hair growth normal.  Neurologic Normal speech. Oriented to person, place, and time. Epicritic sensation to light touch grossly present bilaterally.  Dermatologic Nails well groomed and normal in appearance. No open wounds. No skin lesions.  Orthopedic: POP R medial navicular. Good 1st MPJ ROM. No pain to palpation, 2nd toe rectus.   Radiographs: None Assessment:   1. Tendonitis   2. Pain in right foot    Plan:  Patient was evaluated and treated and all questions answered.  Tendonitis R -Offered injection, declined. -Doing otherwise well and pleased with surgery. WB in normal shoegear without issues. -F/u PRN.  No follow-ups on file.

## 2018-05-26 DIAGNOSIS — N390 Urinary tract infection, site not specified: Secondary | ICD-10-CM | POA: Diagnosis not present

## 2018-05-26 DIAGNOSIS — R300 Dysuria: Secondary | ICD-10-CM | POA: Diagnosis not present

## 2018-05-26 DIAGNOSIS — R350 Frequency of micturition: Secondary | ICD-10-CM | POA: Diagnosis not present

## 2018-06-04 DIAGNOSIS — N390 Urinary tract infection, site not specified: Secondary | ICD-10-CM | POA: Diagnosis not present

## 2018-06-04 DIAGNOSIS — N2581 Secondary hyperparathyroidism of renal origin: Secondary | ICD-10-CM | POA: Diagnosis not present

## 2018-06-04 DIAGNOSIS — D631 Anemia in chronic kidney disease: Secondary | ICD-10-CM | POA: Diagnosis not present

## 2018-06-04 DIAGNOSIS — I129 Hypertensive chronic kidney disease with stage 1 through stage 4 chronic kidney disease, or unspecified chronic kidney disease: Secondary | ICD-10-CM | POA: Diagnosis not present

## 2018-06-04 DIAGNOSIS — N183 Chronic kidney disease, stage 3 (moderate): Secondary | ICD-10-CM | POA: Diagnosis not present

## 2018-06-04 DIAGNOSIS — E785 Hyperlipidemia, unspecified: Secondary | ICD-10-CM | POA: Diagnosis not present

## 2018-06-04 DIAGNOSIS — K743 Primary biliary cirrhosis: Secondary | ICD-10-CM | POA: Diagnosis not present

## 2018-06-09 ENCOUNTER — Encounter: Payer: Self-pay | Admitting: Podiatry

## 2018-06-10 DIAGNOSIS — N390 Urinary tract infection, site not specified: Secondary | ICD-10-CM | POA: Diagnosis not present

## 2018-06-10 DIAGNOSIS — R300 Dysuria: Secondary | ICD-10-CM | POA: Diagnosis not present

## 2018-06-10 DIAGNOSIS — Z6825 Body mass index (BMI) 25.0-25.9, adult: Secondary | ICD-10-CM | POA: Diagnosis not present

## 2018-06-10 DIAGNOSIS — Z8619 Personal history of other infectious and parasitic diseases: Secondary | ICD-10-CM | POA: Diagnosis not present

## 2018-07-09 DIAGNOSIS — N390 Urinary tract infection, site not specified: Secondary | ICD-10-CM | POA: Diagnosis not present

## 2018-07-09 DIAGNOSIS — B9620 Unspecified Escherichia coli [E. coli] as the cause of diseases classified elsewhere: Secondary | ICD-10-CM | POA: Diagnosis not present

## 2018-07-16 DIAGNOSIS — R040 Epistaxis: Secondary | ICD-10-CM | POA: Diagnosis not present

## 2018-07-16 DIAGNOSIS — Z7982 Long term (current) use of aspirin: Secondary | ICD-10-CM | POA: Diagnosis not present

## 2018-07-16 DIAGNOSIS — I10 Essential (primary) hypertension: Secondary | ICD-10-CM | POA: Diagnosis not present

## 2018-07-17 DIAGNOSIS — J342 Deviated nasal septum: Secondary | ICD-10-CM | POA: Diagnosis not present

## 2018-07-17 DIAGNOSIS — Z7982 Long term (current) use of aspirin: Secondary | ICD-10-CM | POA: Diagnosis not present

## 2018-07-17 DIAGNOSIS — R040 Epistaxis: Secondary | ICD-10-CM | POA: Diagnosis not present

## 2018-08-18 DIAGNOSIS — N390 Urinary tract infection, site not specified: Secondary | ICD-10-CM | POA: Diagnosis not present

## 2018-09-25 DIAGNOSIS — R079 Chest pain, unspecified: Secondary | ICD-10-CM | POA: Diagnosis not present

## 2018-09-25 DIAGNOSIS — R6883 Chills (without fever): Secondary | ICD-10-CM | POA: Diagnosis not present

## 2018-09-25 DIAGNOSIS — R05 Cough: Secondary | ICD-10-CM | POA: Diagnosis not present

## 2018-09-25 DIAGNOSIS — R52 Pain, unspecified: Secondary | ICD-10-CM | POA: Diagnosis not present

## 2018-09-25 DIAGNOSIS — R0602 Shortness of breath: Secondary | ICD-10-CM | POA: Diagnosis not present

## 2018-09-25 DIAGNOSIS — Z6825 Body mass index (BMI) 25.0-25.9, adult: Secondary | ICD-10-CM | POA: Diagnosis not present

## 2018-10-09 DIAGNOSIS — N390 Urinary tract infection, site not specified: Secondary | ICD-10-CM | POA: Diagnosis not present

## 2018-10-22 DIAGNOSIS — N3010 Interstitial cystitis (chronic) without hematuria: Secondary | ICD-10-CM | POA: Diagnosis not present

## 2018-11-10 DIAGNOSIS — L820 Inflamed seborrheic keratosis: Secondary | ICD-10-CM | POA: Diagnosis not present

## 2018-11-10 DIAGNOSIS — L578 Other skin changes due to chronic exposure to nonionizing radiation: Secondary | ICD-10-CM | POA: Diagnosis not present

## 2018-11-28 DIAGNOSIS — Z6824 Body mass index (BMI) 24.0-24.9, adult: Secondary | ICD-10-CM | POA: Diagnosis not present

## 2018-11-28 DIAGNOSIS — R300 Dysuria: Secondary | ICD-10-CM | POA: Diagnosis not present

## 2018-11-28 DIAGNOSIS — N3010 Interstitial cystitis (chronic) without hematuria: Secondary | ICD-10-CM | POA: Diagnosis not present

## 2018-12-08 DIAGNOSIS — Z1339 Encounter for screening examination for other mental health and behavioral disorders: Secondary | ICD-10-CM | POA: Diagnosis not present

## 2018-12-08 DIAGNOSIS — N183 Chronic kidney disease, stage 3 (moderate): Secondary | ICD-10-CM | POA: Diagnosis not present

## 2018-12-08 DIAGNOSIS — Z0000 Encounter for general adult medical examination without abnormal findings: Secondary | ICD-10-CM | POA: Diagnosis not present

## 2018-12-08 DIAGNOSIS — Z1331 Encounter for screening for depression: Secondary | ICD-10-CM | POA: Diagnosis not present

## 2018-12-08 DIAGNOSIS — E538 Deficiency of other specified B group vitamins: Secondary | ICD-10-CM | POA: Diagnosis not present

## 2018-12-08 DIAGNOSIS — E039 Hypothyroidism, unspecified: Secondary | ICD-10-CM | POA: Diagnosis not present

## 2018-12-08 DIAGNOSIS — E2839 Other primary ovarian failure: Secondary | ICD-10-CM | POA: Diagnosis not present

## 2018-12-08 DIAGNOSIS — R300 Dysuria: Secondary | ICD-10-CM | POA: Diagnosis not present

## 2018-12-08 DIAGNOSIS — I10 Essential (primary) hypertension: Secondary | ICD-10-CM | POA: Diagnosis not present

## 2018-12-08 DIAGNOSIS — Z1231 Encounter for screening mammogram for malignant neoplasm of breast: Secondary | ICD-10-CM | POA: Diagnosis not present

## 2018-12-08 DIAGNOSIS — Z79899 Other long term (current) drug therapy: Secondary | ICD-10-CM | POA: Diagnosis not present

## 2018-12-08 DIAGNOSIS — N3010 Interstitial cystitis (chronic) without hematuria: Secondary | ICD-10-CM | POA: Diagnosis not present

## 2018-12-10 DIAGNOSIS — R3989 Other symptoms and signs involving the genitourinary system: Secondary | ICD-10-CM | POA: Diagnosis not present

## 2018-12-10 DIAGNOSIS — N390 Urinary tract infection, site not specified: Secondary | ICD-10-CM | POA: Diagnosis not present

## 2019-01-12 DIAGNOSIS — R399 Unspecified symptoms and signs involving the genitourinary system: Secondary | ICD-10-CM | POA: Diagnosis not present

## 2019-01-29 DIAGNOSIS — E785 Hyperlipidemia, unspecified: Secondary | ICD-10-CM | POA: Diagnosis not present

## 2019-01-29 DIAGNOSIS — N183 Chronic kidney disease, stage 3 (moderate): Secondary | ICD-10-CM | POA: Diagnosis not present

## 2019-01-29 DIAGNOSIS — N390 Urinary tract infection, site not specified: Secondary | ICD-10-CM | POA: Diagnosis not present

## 2019-01-29 DIAGNOSIS — I129 Hypertensive chronic kidney disease with stage 1 through stage 4 chronic kidney disease, or unspecified chronic kidney disease: Secondary | ICD-10-CM | POA: Diagnosis not present

## 2019-01-29 DIAGNOSIS — D631 Anemia in chronic kidney disease: Secondary | ICD-10-CM | POA: Diagnosis not present

## 2019-01-29 DIAGNOSIS — N2581 Secondary hyperparathyroidism of renal origin: Secondary | ICD-10-CM | POA: Diagnosis not present

## 2019-01-29 DIAGNOSIS — N184 Chronic kidney disease, stage 4 (severe): Secondary | ICD-10-CM | POA: Diagnosis not present

## 2019-01-29 DIAGNOSIS — K743 Primary biliary cirrhosis: Secondary | ICD-10-CM | POA: Diagnosis not present

## 2019-02-16 DIAGNOSIS — H04123 Dry eye syndrome of bilateral lacrimal glands: Secondary | ICD-10-CM | POA: Diagnosis not present

## 2019-02-28 DIAGNOSIS — R21 Rash and other nonspecific skin eruption: Secondary | ICD-10-CM | POA: Diagnosis not present

## 2019-03-04 DIAGNOSIS — Z1231 Encounter for screening mammogram for malignant neoplasm of breast: Secondary | ICD-10-CM | POA: Diagnosis not present

## 2019-03-04 DIAGNOSIS — E2839 Other primary ovarian failure: Secondary | ICD-10-CM | POA: Diagnosis not present

## 2019-03-04 DIAGNOSIS — M8589 Other specified disorders of bone density and structure, multiple sites: Secondary | ICD-10-CM | POA: Diagnosis not present

## 2019-03-09 DIAGNOSIS — L304 Erythema intertrigo: Secondary | ICD-10-CM | POA: Diagnosis not present

## 2019-03-23 DIAGNOSIS — L304 Erythema intertrigo: Secondary | ICD-10-CM | POA: Diagnosis not present

## 2019-03-25 DIAGNOSIS — L309 Dermatitis, unspecified: Secondary | ICD-10-CM | POA: Diagnosis not present

## 2019-03-25 DIAGNOSIS — L299 Pruritus, unspecified: Secondary | ICD-10-CM | POA: Diagnosis not present

## 2019-03-25 DIAGNOSIS — L304 Erythema intertrigo: Secondary | ICD-10-CM | POA: Diagnosis not present

## 2019-04-02 DIAGNOSIS — S3141XA Laceration without foreign body of vagina and vulva, initial encounter: Secondary | ICD-10-CM | POA: Diagnosis not present

## 2019-04-02 DIAGNOSIS — Z6823 Body mass index (BMI) 23.0-23.9, adult: Secondary | ICD-10-CM | POA: Diagnosis not present

## 2019-04-13 DIAGNOSIS — Z23 Encounter for immunization: Secondary | ICD-10-CM | POA: Diagnosis not present

## 2019-04-13 DIAGNOSIS — S61401A Unspecified open wound of right hand, initial encounter: Secondary | ICD-10-CM | POA: Diagnosis not present

## 2019-04-14 DIAGNOSIS — N390 Urinary tract infection, site not specified: Secondary | ICD-10-CM | POA: Diagnosis not present

## 2019-04-14 DIAGNOSIS — Z9889 Other specified postprocedural states: Secondary | ICD-10-CM | POA: Diagnosis not present

## 2019-04-14 DIAGNOSIS — R82998 Other abnormal findings in urine: Secondary | ICD-10-CM | POA: Diagnosis not present

## 2019-04-14 DIAGNOSIS — R310 Gross hematuria: Secondary | ICD-10-CM | POA: Diagnosis not present

## 2019-04-14 DIAGNOSIS — Z87440 Personal history of urinary (tract) infections: Secondary | ICD-10-CM | POA: Diagnosis not present

## 2019-04-17 ENCOUNTER — Other Ambulatory Visit: Payer: Self-pay

## 2019-04-17 ENCOUNTER — Encounter: Payer: Medicare Other | Attending: Nephrology | Admitting: *Deleted

## 2019-04-17 DIAGNOSIS — N184 Chronic kidney disease, stage 4 (severe): Secondary | ICD-10-CM | POA: Diagnosis present

## 2019-04-17 NOTE — Progress Notes (Signed)
Medical Nutrition Therapy:  Appt start time: 1100 end time:  1200.   Assessment:  Primary concerns today: Renal Disease, Stage 4 Patient states she would like to learn what to eat and what to avoid to preserve her kidney function.. She has received dietary guidelines for Interstitial Cystitis that she is making a strong effort to follow as well. She would like to know how to combine the nutrition guidelines with her renal failure today.  Preferred Learning Style:   Auditory  Visual  Hands on  Learning Readiness:   Ready and  Change in progress   MEDICATIONS: see list   DIETARY INTAKE:  Usual eating pattern includes 3 meals and several snacks per day.  Everyday foods include good variety of all food groups with emphasis on probiotics.  Avoided foods include foods listed on her Interstitial Cystitis handout.    24-hr recall:  B ( AM): shake with pear, blueberry juices, unsweetened canned pears, Greek yogurt and almond milk  Snk ( AM): butter cookie with no salt OR shortbread cookie that is low salt occasionally  L ( PM): skips about 5 days a week, not hungry, OR fried egg sandwich Snk ( PM): occasional D ( PM): cooks all meals at home; fresh meat, rice or would like potatoes if OK, vegetables Snk ( PM): hard candy Beverages: almond milk, peppermint tea, water, flat root beer   Progress Towards Goal(s):  In progress.   Nutritional Diagnosis:  NB-1.1 Food and nutrition-related knowledge deficit As related to renal diet combined with Interstitial Cystitis nutrition guidelines.  As evidenced by GFR under 30.    Intervention:  Nutrition counseling for Stage 4 Renal Failure combined with Interstitial Cystitis education initiated. Discussed potential components of renal diet including protein, potassium, phosphorus, sodium, fluid restriction. We reviewed her lab work and these components appear to be in normal ranges currently We also reviewed her handout provided by her MD  regarding Interstitial Cystitis and discussed common foods on both lists. Patient appears to be over restricting some of her foods and states she looks forward to expanding her food choices now.  Plan:  You are already following the guidelines for Interstitial Cystitis Diet which limits some foods that are addressed with the Renal Diet already such as Potassium and Phosphorus You might consider boiling your potatoes and rinsing twice maybe once a week for now. Include about 6 oz of protein (42 grams) per day with your meals and snacks It does not appear that you need a fluid restriction at this time   Teaching Method Utilized:  Visual Auditory Hands on  Handouts given during visit include:  [place] a Meal (copy)  Barriers to learning/adherence to lifestyle change: none  Demonstrated degree of understanding via:  Teach Back   Monitoring/Evaluation:  Dietary intake, exercise, reading food labels prn.

## 2019-04-17 NOTE — Patient Instructions (Signed)
Plan:  You are already following the guidelines for Interstitial Cystitis Diet which limits some foods that are addressed with the Renal Diet already such as Potassium and Phosphorus You might consider boiling your potatoes and rinsing twice maybe once a week for now. Include about 6 oz of protein (42 grams) per day with your meals and snacks It does not appear that you need a fluid restriction at this time

## 2019-05-29 DIAGNOSIS — Z1231 Encounter for screening mammogram for malignant neoplasm of breast: Secondary | ICD-10-CM | POA: Diagnosis not present

## 2019-06-16 DIAGNOSIS — N390 Urinary tract infection, site not specified: Secondary | ICD-10-CM | POA: Diagnosis not present

## 2019-06-16 DIAGNOSIS — N184 Chronic kidney disease, stage 4 (severe): Secondary | ICD-10-CM | POA: Diagnosis not present

## 2019-06-16 DIAGNOSIS — N2581 Secondary hyperparathyroidism of renal origin: Secondary | ICD-10-CM | POA: Diagnosis not present

## 2019-06-16 DIAGNOSIS — E785 Hyperlipidemia, unspecified: Secondary | ICD-10-CM | POA: Diagnosis not present

## 2019-06-16 DIAGNOSIS — K743 Primary biliary cirrhosis: Secondary | ICD-10-CM | POA: Diagnosis not present

## 2019-06-16 DIAGNOSIS — I129 Hypertensive chronic kidney disease with stage 1 through stage 4 chronic kidney disease, or unspecified chronic kidney disease: Secondary | ICD-10-CM | POA: Diagnosis not present

## 2019-06-16 DIAGNOSIS — N189 Chronic kidney disease, unspecified: Secondary | ICD-10-CM | POA: Diagnosis not present

## 2019-06-24 DIAGNOSIS — Z6822 Body mass index (BMI) 22.0-22.9, adult: Secondary | ICD-10-CM | POA: Diagnosis not present

## 2019-06-24 DIAGNOSIS — Z1331 Encounter for screening for depression: Secondary | ICD-10-CM | POA: Diagnosis not present

## 2019-06-24 DIAGNOSIS — I10 Essential (primary) hypertension: Secondary | ICD-10-CM | POA: Diagnosis not present

## 2019-06-24 DIAGNOSIS — N184 Chronic kidney disease, stage 4 (severe): Secondary | ICD-10-CM | POA: Diagnosis not present

## 2019-06-24 DIAGNOSIS — E039 Hypothyroidism, unspecified: Secondary | ICD-10-CM | POA: Diagnosis not present

## 2019-06-24 DIAGNOSIS — N3010 Interstitial cystitis (chronic) without hematuria: Secondary | ICD-10-CM | POA: Diagnosis not present

## 2019-06-24 DIAGNOSIS — N951 Menopausal and female climacteric states: Secondary | ICD-10-CM | POA: Diagnosis not present

## 2019-07-16 IMAGING — US US RENAL
1 series · 14 of 25 positions shown · non-contrast
Comparison: CT 12/02/2016.

CLINICAL DATA: Chronic renal disease.  Recent UTI.

EXAM:
RENAL / URINARY TRACT ULTRASOUND COMPLETE

[Series 1: us renal · 0.23mm/px · 14 of 46 slices shown]
[im 1/46]
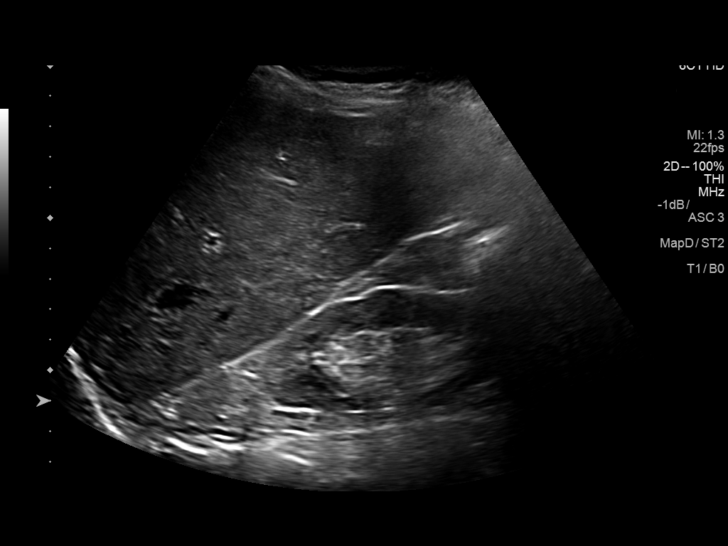
[im 4/46]
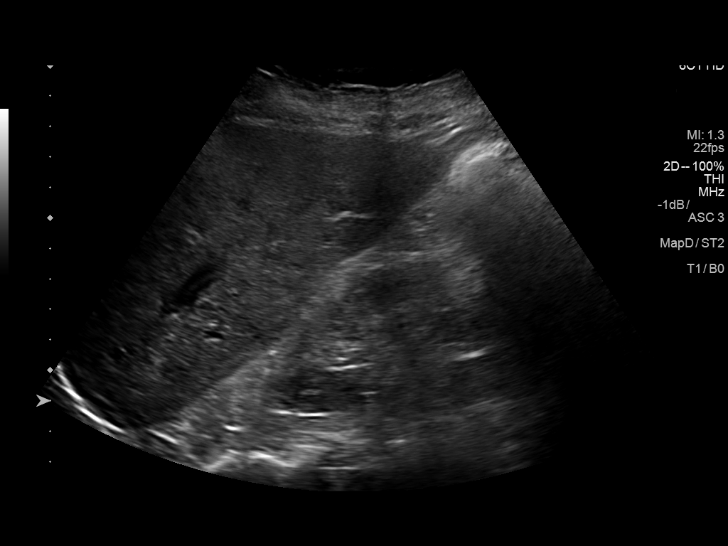
[im 8/46]
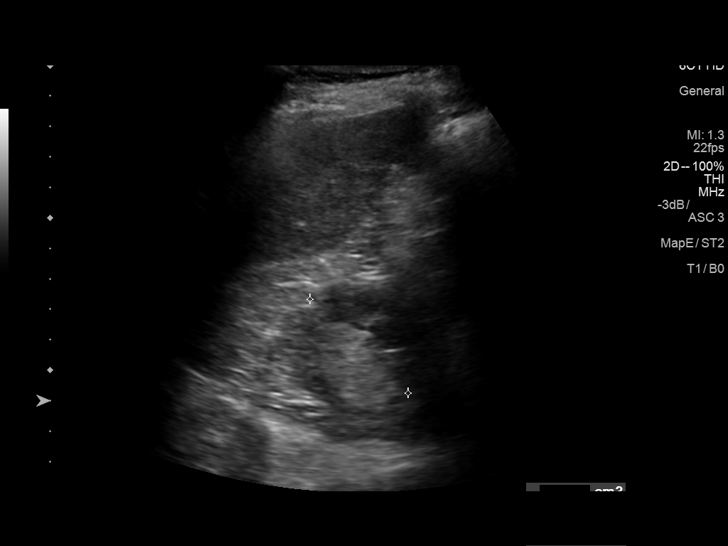
[im 12/46]
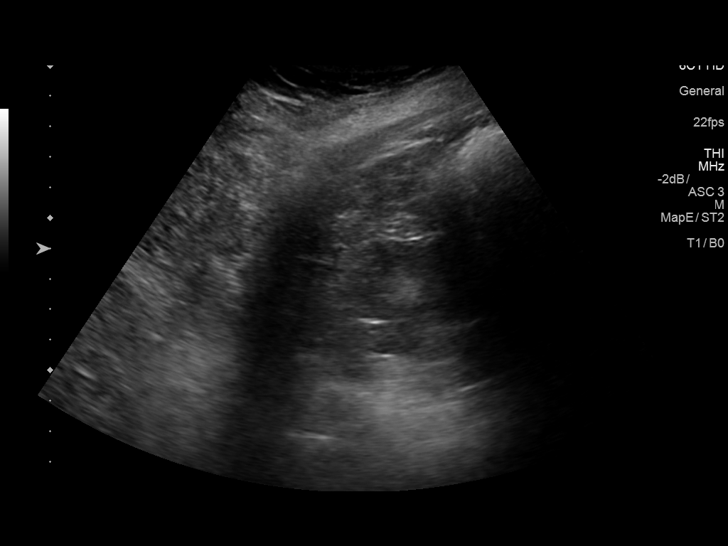
[im 16/46]
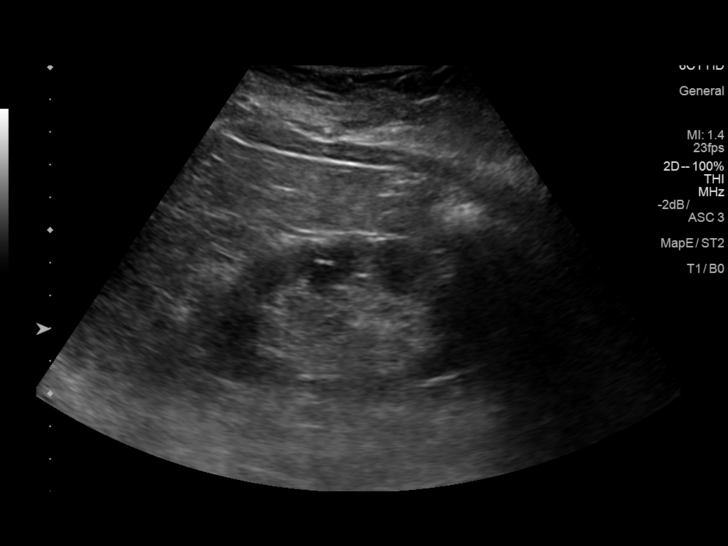
[im 17/46]
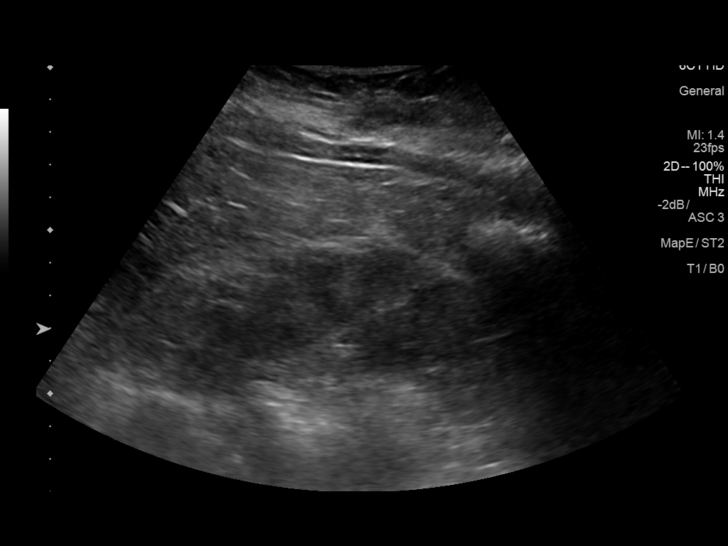
[im 21/46]
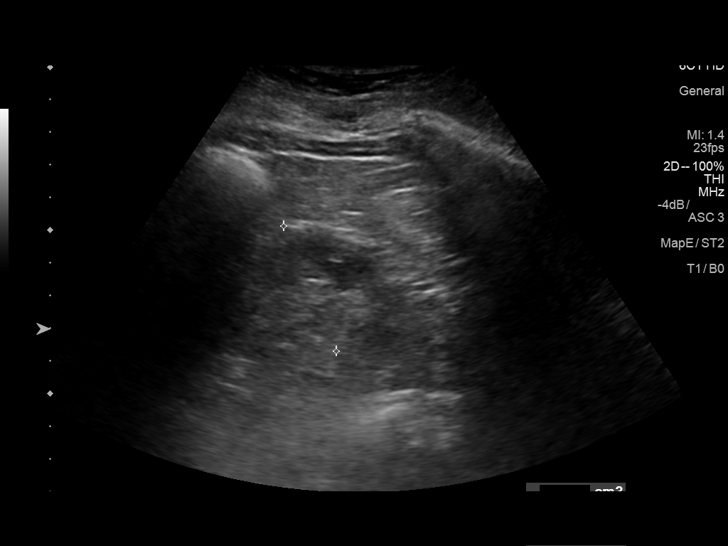
[im 25/46]
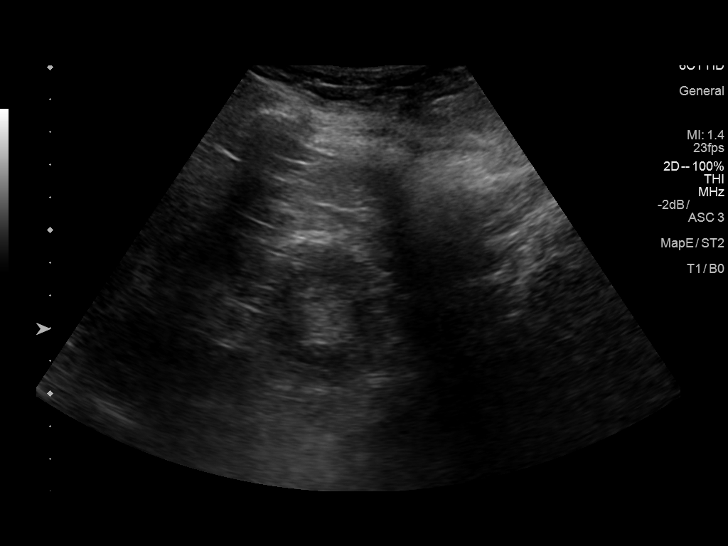
[im 29/46]
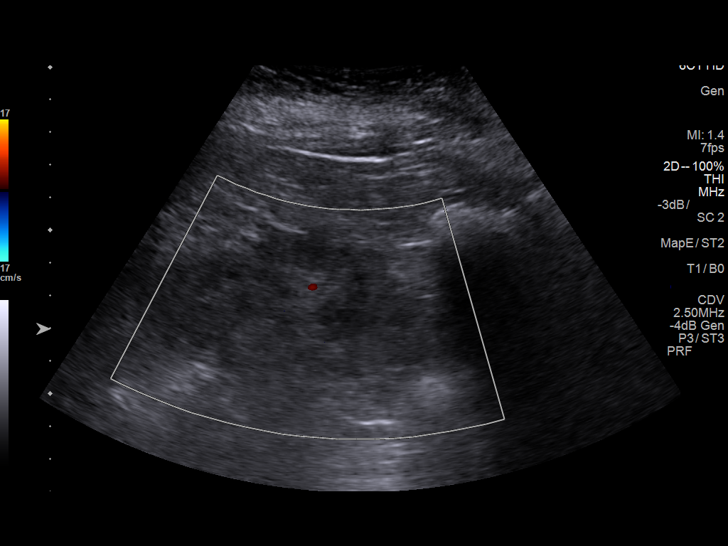
[im 31/46]
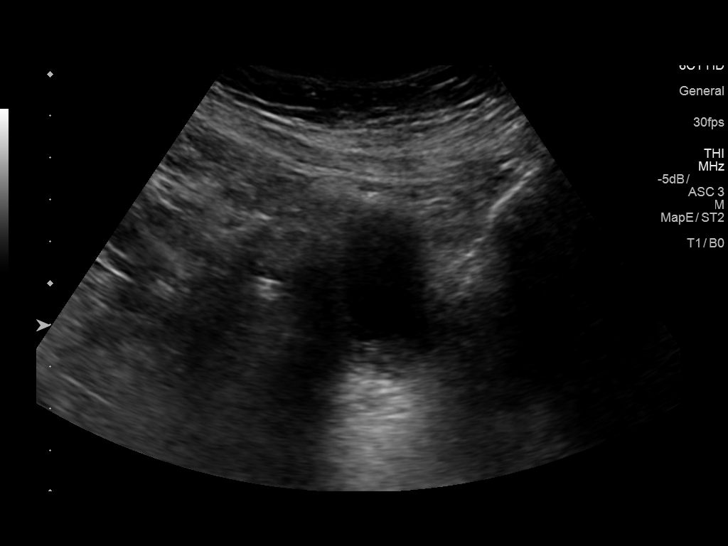
[im 34/46]
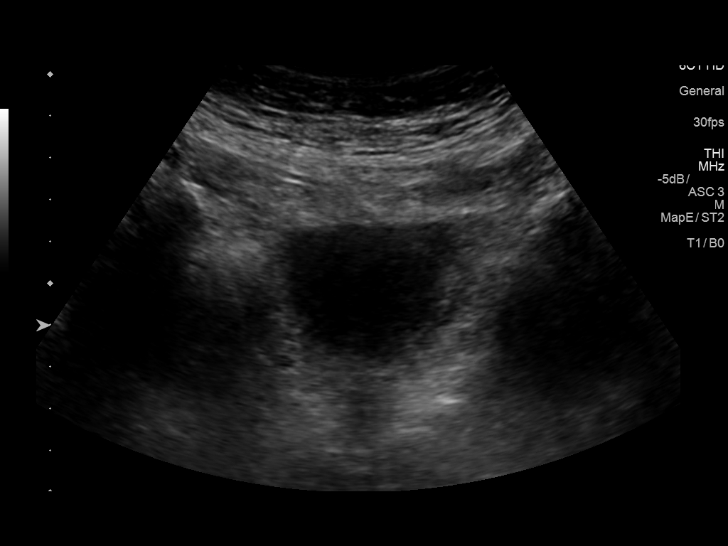
[im 38/46]
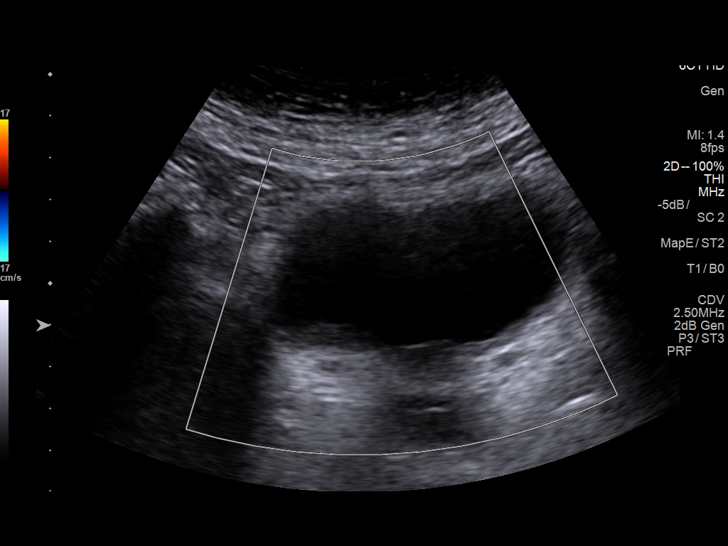
[im 42/46]
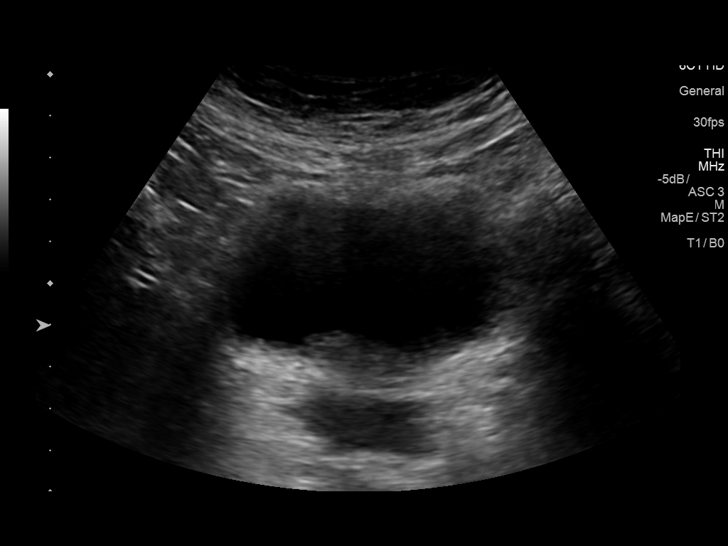
[im 46/46]
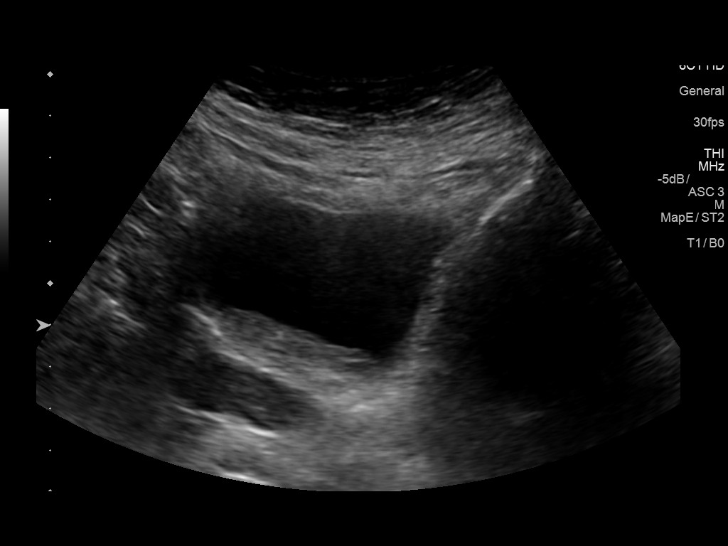

[14 of 25 positions shown; findings below may reference images not displayed]

FINDINGS: Right Kidney:

Renal measurements: 9.7 x 4.2 x 4.5 cm = volume: 94.3 mL. Increased
echogenicity. No mass or hydronephrosis visualized.

Left Kidney:

Renal measurements: 8.1 x 4.1 x 4.2 cm = volume: 71 mL. Increased
echogenicity. No mass or hydronephrosis visualized.

Bladder:

Internal bladder debris versus bladder wall thickening noted along
the right mid posterior bladder wall. Follow-up exam is suggested
after patient's UTI has resolved to exclude focal persistent bladder
wall thickening/mass.
IMPRESSION: 1. Increased renal echogenicity noted bilaterally consistent chronic
medical renal disease. No acute renal abnormality identified.

2. Internal bladder debris versus bladder wall thickening noted
along the right mid posterior bladder wall. Follow-up exam is
suggested after the patient's UTI has resolved to exclude focal
persistent bladder wall thickening/mass.

## 2019-09-08 DIAGNOSIS — Z7189 Other specified counseling: Secondary | ICD-10-CM | POA: Diagnosis not present

## 2019-09-08 DIAGNOSIS — R300 Dysuria: Secondary | ICD-10-CM | POA: Diagnosis not present

## 2019-12-01 DIAGNOSIS — Z1339 Encounter for screening examination for other mental health and behavioral disorders: Secondary | ICD-10-CM | POA: Diagnosis not present

## 2019-12-01 DIAGNOSIS — Z1331 Encounter for screening for depression: Secondary | ICD-10-CM | POA: Diagnosis not present

## 2019-12-01 DIAGNOSIS — Z0000 Encounter for general adult medical examination without abnormal findings: Secondary | ICD-10-CM | POA: Diagnosis not present

## 2019-12-01 DIAGNOSIS — Z6821 Body mass index (BMI) 21.0-21.9, adult: Secondary | ICD-10-CM | POA: Diagnosis not present

## 2019-12-23 DIAGNOSIS — S60041A Contusion of right ring finger without damage to nail, initial encounter: Secondary | ICD-10-CM | POA: Diagnosis not present

## 2019-12-23 DIAGNOSIS — H02055 Trichiasis without entropian left lower eyelid: Secondary | ICD-10-CM | POA: Diagnosis not present

## 2019-12-30 DIAGNOSIS — S62644A Nondisplaced fracture of proximal phalanx of right ring finger, initial encounter for closed fracture: Secondary | ICD-10-CM | POA: Diagnosis not present

## 2020-01-12 DIAGNOSIS — Z23 Encounter for immunization: Secondary | ICD-10-CM | POA: Diagnosis not present

## 2020-01-13 DIAGNOSIS — S62644D Nondisplaced fracture of proximal phalanx of right ring finger, subsequent encounter for fracture with routine healing: Secondary | ICD-10-CM | POA: Diagnosis not present

## 2020-02-10 DIAGNOSIS — R209 Unspecified disturbances of skin sensation: Secondary | ICD-10-CM | POA: Diagnosis not present

## 2020-02-10 DIAGNOSIS — M79604 Pain in right leg: Secondary | ICD-10-CM | POA: Diagnosis not present

## 2020-02-10 DIAGNOSIS — M7989 Other specified soft tissue disorders: Secondary | ICD-10-CM | POA: Diagnosis not present

## 2020-02-12 DIAGNOSIS — S62644D Nondisplaced fracture of proximal phalanx of right ring finger, subsequent encounter for fracture with routine healing: Secondary | ICD-10-CM | POA: Diagnosis not present

## 2020-03-04 DIAGNOSIS — H04123 Dry eye syndrome of bilateral lacrimal glands: Secondary | ICD-10-CM | POA: Diagnosis not present

## 2020-03-21 DIAGNOSIS — L578 Other skin changes due to chronic exposure to nonionizing radiation: Secondary | ICD-10-CM | POA: Diagnosis not present

## 2020-03-21 DIAGNOSIS — L570 Actinic keratosis: Secondary | ICD-10-CM | POA: Diagnosis not present

## 2020-05-30 DIAGNOSIS — I129 Hypertensive chronic kidney disease with stage 1 through stage 4 chronic kidney disease, or unspecified chronic kidney disease: Secondary | ICD-10-CM | POA: Diagnosis not present

## 2020-05-30 DIAGNOSIS — N2581 Secondary hyperparathyroidism of renal origin: Secondary | ICD-10-CM | POA: Diagnosis not present

## 2020-05-30 DIAGNOSIS — N184 Chronic kidney disease, stage 4 (severe): Secondary | ICD-10-CM | POA: Diagnosis not present

## 2020-05-30 DIAGNOSIS — K743 Primary biliary cirrhosis: Secondary | ICD-10-CM | POA: Diagnosis not present

## 2020-05-30 DIAGNOSIS — E785 Hyperlipidemia, unspecified: Secondary | ICD-10-CM | POA: Diagnosis not present

## 2020-05-30 DIAGNOSIS — N390 Urinary tract infection, site not specified: Secondary | ICD-10-CM | POA: Diagnosis not present

## 2020-06-22 DIAGNOSIS — E039 Hypothyroidism, unspecified: Secondary | ICD-10-CM | POA: Diagnosis not present

## 2020-06-22 DIAGNOSIS — R300 Dysuria: Secondary | ICD-10-CM | POA: Diagnosis not present

## 2020-06-22 DIAGNOSIS — Z79899 Other long term (current) drug therapy: Secondary | ICD-10-CM | POA: Diagnosis not present

## 2020-06-23 DIAGNOSIS — Z6820 Body mass index (BMI) 20.0-20.9, adult: Secondary | ICD-10-CM | POA: Diagnosis not present

## 2020-06-23 DIAGNOSIS — K219 Gastro-esophageal reflux disease without esophagitis: Secondary | ICD-10-CM | POA: Diagnosis not present

## 2020-06-23 DIAGNOSIS — Z1331 Encounter for screening for depression: Secondary | ICD-10-CM | POA: Diagnosis not present

## 2020-06-23 DIAGNOSIS — E039 Hypothyroidism, unspecified: Secondary | ICD-10-CM | POA: Diagnosis not present

## 2020-06-23 DIAGNOSIS — N3010 Interstitial cystitis (chronic) without hematuria: Secondary | ICD-10-CM | POA: Diagnosis not present

## 2020-06-23 DIAGNOSIS — N184 Chronic kidney disease, stage 4 (severe): Secondary | ICD-10-CM | POA: Diagnosis not present

## 2020-07-19 DIAGNOSIS — I129 Hypertensive chronic kidney disease with stage 1 through stage 4 chronic kidney disease, or unspecified chronic kidney disease: Secondary | ICD-10-CM | POA: Diagnosis not present

## 2020-07-19 DIAGNOSIS — N184 Chronic kidney disease, stage 4 (severe): Secondary | ICD-10-CM | POA: Diagnosis not present

## 2020-07-19 DIAGNOSIS — N2581 Secondary hyperparathyroidism of renal origin: Secondary | ICD-10-CM | POA: Diagnosis not present

## 2020-07-19 DIAGNOSIS — E785 Hyperlipidemia, unspecified: Secondary | ICD-10-CM | POA: Diagnosis not present

## 2020-07-19 DIAGNOSIS — N390 Urinary tract infection, site not specified: Secondary | ICD-10-CM | POA: Diagnosis not present

## 2020-07-19 DIAGNOSIS — K743 Primary biliary cirrhosis: Secondary | ICD-10-CM | POA: Diagnosis not present

## 2020-08-23 DIAGNOSIS — Z96652 Presence of left artificial knee joint: Secondary | ICD-10-CM | POA: Diagnosis not present

## 2020-08-23 DIAGNOSIS — M25561 Pain in right knee: Secondary | ICD-10-CM | POA: Diagnosis not present

## 2020-08-23 DIAGNOSIS — Z96642 Presence of left artificial hip joint: Secondary | ICD-10-CM | POA: Diagnosis not present

## 2020-08-23 DIAGNOSIS — M1711 Unilateral primary osteoarthritis, right knee: Secondary | ICD-10-CM | POA: Diagnosis not present

## 2020-08-23 DIAGNOSIS — G8929 Other chronic pain: Secondary | ICD-10-CM | POA: Diagnosis not present

## 2020-08-25 DIAGNOSIS — I10 Essential (primary) hypertension: Secondary | ICD-10-CM | POA: Diagnosis not present

## 2020-08-25 DIAGNOSIS — Z01818 Encounter for other preprocedural examination: Secondary | ICD-10-CM | POA: Diagnosis not present

## 2020-08-25 DIAGNOSIS — Z6821 Body mass index (BMI) 21.0-21.9, adult: Secondary | ICD-10-CM | POA: Diagnosis not present

## 2020-09-07 DIAGNOSIS — Z01818 Encounter for other preprocedural examination: Secondary | ICD-10-CM | POA: Diagnosis not present

## 2020-09-20 DIAGNOSIS — K1120 Sialoadenitis, unspecified: Secondary | ICD-10-CM | POA: Diagnosis not present

## 2020-10-04 DIAGNOSIS — H9190 Unspecified hearing loss, unspecified ear: Secondary | ICD-10-CM | POA: Diagnosis not present

## 2020-10-04 DIAGNOSIS — H61303 Acquired stenosis of external ear canal, unspecified, bilateral: Secondary | ICD-10-CM | POA: Diagnosis not present

## 2020-10-04 DIAGNOSIS — M542 Cervicalgia: Secondary | ICD-10-CM | POA: Diagnosis not present

## 2020-10-04 DIAGNOSIS — Z9889 Other specified postprocedural states: Secondary | ICD-10-CM | POA: Diagnosis not present

## 2020-10-04 DIAGNOSIS — J342 Deviated nasal septum: Secondary | ICD-10-CM | POA: Diagnosis not present

## 2020-10-04 DIAGNOSIS — M25561 Pain in right knee: Secondary | ICD-10-CM | POA: Diagnosis not present

## 2020-10-06 ENCOUNTER — Other Ambulatory Visit (HOSPITAL_COMMUNITY): Payer: Medicare Other

## 2020-10-06 NOTE — Progress Notes (Signed)
Pt. Needs orders for upcoming surgery.PAT and labs on 10/07/20.

## 2020-10-06 NOTE — Patient Instructions (Addendum)
DUE TO COVID-19 ONLY ONE VISITOR IS ALLOWED TO COME WITH YOU AND STAY IN THE WAITING ROOM ONLY DURING PRE OP AND PROCEDURE DAY OF SURGERY. THE 1 VISITOR  MAY VISIT WITH YOU AFTER SURGERY IN YOUR PRIVATE ROOM DURING VISITING HOURS ONLY!  YOU NEED TO HAVE A COVID 19 TEST ON: 10/14/20 @ 11:00 AM , THIS TEST MUST BE DONE BEFORE SURGERY,  COVID TESTING SITE [address] [place] [address], IT IS ON THE RIGHT GOING OUT [address] APPROXIMATELY  2 MINUTES PAST [employer] ON THE RIGHT. ONCE YOUR COVID TEST IS COMPLETED,  PLEASE BEGIN THE QUARANTINE INSTRUCTIONS AS OUTLINED IN YOUR HANDOUT.                Jill Fletcher   Your procedure is scheduled on: 10/17/20   Report to [facility] Main  Entrance   Report to short stay at: 5:15 AM     Call this number if you have problems the morning of surgery [phone]    Remember: Do not eat food or drink liquids :After Midnight.   BRUSH YOUR TEETH MORNING OF SURGERY AND RINSE YOUR MOUTH OUT, NO CHEWING GUM CANDY OR MINTS.    Take these medicines the morning of surgery with A SIP OF WATER:sertraline,synthroid,pantoprazole.                                You may not have any metal on your body including hair pins and              piercings  Do not wear jewelry, make-up, lotions, powders or perfumes, deodorant             Do not wear nail polish on your fingernails.  Do not shave  48 hours prior to surgery.    Do not bring valuables to the hospital. [place].  Contacts, dentures or bridgework may not be worn into surgery.  Leave suitcase in the car. After surgery it may be brought to your room.     Patients discharged the day of surgery will not be allowed to drive home. IF YOU ARE HAVING SURGERY AND GOING HOME THE SAME DAY, YOU MUST HAVE AN ADULT TO DRIVE YOU HOME AND BE WITH YOU FOR 24 HOURS. YOU MAY GO HOME BY TAXI OR UBER OR ORTHERWISE, BUT AN ADULT MUST ACCOMPANY  YOU HOME AND STAY WITH YOU FOR 24 HOURS.  Name and phone number of your driver:  Special Instructions: N/A              Please read over the following fact sheets you were given: _____________________________________________________________________          [facility] - Preparing for Surgery Before surgery, you can play an important role.  Because skin is not sterile, your skin needs to be as free of germs as possible.  You can reduce the number of germs on your skin by washing with CHG (chlorahexidine gluconate) soap before surgery.  CHG is an antiseptic cleaner which kills germs and bonds with the skin to continue killing germs even after washing. Please DO NOT use if you have an allergy to CHG or antibacterial soaps.  If your skin becomes reddened/irritated stop using the CHG and inform your nurse when you arrive at Short Stay. Do  not shave (including legs and underarms) for at least 48 hours prior to the first CHG shower.  You may shave your face/neck. Please follow these instructions carefully:  1.  Shower with CHG Soap the night before surgery and the  morning of Surgery.  2.  If you choose to wash your hair, wash your hair first as usual with your  normal  shampoo.  3.  After you shampoo, rinse your hair and body thoroughly to remove the  shampoo.                           4.  Use CHG as you would any other liquid soap.  You can apply chg directly  to the skin and wash                       Gently with a scrungie or clean washcloth.  5.  Apply the CHG Soap to your body ONLY FROM THE NECK DOWN.   Do not use on face/ open                           Wound or open sores. Avoid contact with eyes, ears mouth and genitals (private parts).                       Wash face,  Genitals (private parts) with your normal soap.             6.  Wash thoroughly, paying special attention to the area where your surgery  will be performed.  7.  Thoroughly rinse your body with warm water from the neck  down.  8.  DO NOT shower/wash with your normal soap after using and rinsing off  the CHG Soap.                9.  Pat yourself dry with a clean towel.            10.  Wear clean pajamas.            11.  Place clean sheets on your bed the night of your first shower and do not  sleep with pets. Day of Surgery : Do not apply any lotions/deodorants the morning of surgery.  Please wear clean clothes to the hospital/surgery center.  FAILURE TO FOLLOW THESE INSTRUCTIONS MAY RESULT IN THE CANCELLATION OF YOUR SURGERY PATIENT SIGNATURE_________________________________  NURSE SIGNATURE__________________________________  ________________________________________________________________________

## 2020-10-07 ENCOUNTER — Encounter (HOSPITAL_COMMUNITY)
Admission: RE | Admit: 2020-10-07 | Discharge: 2020-10-07 | Disposition: A | Discharge status: Home / Self Care | Payer: Medicare Other | Source: Ambulatory Visit | Attending: Orthopedic Surgery | Admitting: Orthopedic Surgery

## 2020-10-07 ENCOUNTER — Other Ambulatory Visit: Payer: Self-pay

## 2020-10-07 ENCOUNTER — Encounter (HOSPITAL_COMMUNITY): Payer: Self-pay

## 2020-10-07 DIAGNOSIS — Z01818 Encounter for other preprocedural examination: Secondary | ICD-10-CM | POA: Insufficient documentation

## 2020-10-07 HISTORY — DX: Anemia, unspecified: D64.9

## 2020-10-07 HISTORY — DX: Chronic kidney disease, unspecified: N18.9

## 2020-10-07 LAB — BASIC METABOLIC PANEL
Anion gap: 5 (ref 5–15)
BUN: 19 mg/dL (ref 8–23)
CO2: 19 mmol/L — ABNORMAL LOW (ref 22–32)
Calcium: 8.7 mg/dL — ABNORMAL LOW (ref 8.9–10.3)
Chloride: 113 mmol/L — ABNORMAL HIGH (ref 98–111)
Creatinine, Ser: 2.16 mg/dL — ABNORMAL HIGH (ref 0.44–1.00)
GFR, Estimated: 23 mL/min — ABNORMAL LOW (ref >60)
Glucose, Bld: 87 mg/dL (ref 70–99)
Potassium: 4 mmol/L (ref 3.5–5.1)
Sodium: 137 mmol/L (ref 135–145)

## 2020-10-07 LAB — CBC
HCT: 38.9 % (ref 36.0–46.0)
Hemoglobin: 12.7 g/dL (ref 12.0–15.0)
MCH: 32.6 pg (ref 26.0–34.0)
MCHC: 32.6 g/dL (ref 30.0–36.0)
MCV: 100 fL (ref 80.0–100.0)
Platelets: 241 10*3/uL (ref 150–400)
RBC: 3.89 MIL/uL (ref 3.87–5.11)
RDW: 15 % (ref 11.5–15.5)
WBC: 6.7 10*3/uL (ref 4.0–10.5)
nRBC: 0 % (ref 0.0–0.2)

## 2020-10-07 LAB — SURGICAL PCR SCREEN
MRSA, PCR: NEGATIVE
Staphylococcus aureus: NEGATIVE

## 2020-10-07 NOTE — Progress Notes (Signed)
Lab. Results: Creatinine: 2.16

## 2020-10-07 NOTE — Progress Notes (Signed)
COVID Vaccine Completed: Yes Date COVID Vaccine completed: 01/12/20. [place] COVID vaccine manufacturer:    Moderna     PCP - Dr. Ernestene Kiel Cardiologist -   Chest x-ray -  EKG -  Stress Test -  ECHO -  Cardiac Cath -  Pacemaker/ICD device last checked:  Sleep Study -  CPAP -   Fasting Blood Sugar -  Checks Blood Sugar _____ times a day  Blood Thinner Instructions: Aspirin Instructions: Last Dose:  Anesthesia review: Hx: HTN,CKD(stage IV)  Patient denies shortness of breath, fever, cough and chest pain at PAT appointment   Patient verbalized understanding of instructions that were given to them at the PAT appointment. Patient was also instructed that they will need to review over the PAT instructions again at home before surgery.

## 2020-10-12 ENCOUNTER — Other Ambulatory Visit: Payer: Self-pay | Admitting: Orthopedic Surgery

## 2020-10-14 ENCOUNTER — Other Ambulatory Visit (HOSPITAL_COMMUNITY)
Admission: RE | Admit: 2020-10-14 | Discharge: 2020-10-14 | Disposition: A | Discharge status: Home / Self Care | Payer: Medicare Other | Source: Ambulatory Visit | Attending: Orthopedic Surgery | Admitting: Orthopedic Surgery

## 2020-10-14 DIAGNOSIS — Z01812 Encounter for preprocedural laboratory examination: Secondary | ICD-10-CM | POA: Diagnosis not present

## 2020-10-14 DIAGNOSIS — Z20822 Contact with and (suspected) exposure to covid-19: Secondary | ICD-10-CM | POA: Insufficient documentation

## 2020-10-14 LAB — SARS CORONAVIRUS 2 (TAT 6-24 HRS): SARS Coronavirus 2: NEGATIVE

## 2020-10-16 MED ORDER — BUPIVACAINE LIPOSOME 1.3 % IJ SUSP
20.0000 mL | Freq: Once | INTRAMUSCULAR | Status: DC
  Filled 2020-10-16: qty 20

## 2020-10-17 ENCOUNTER — Encounter (HOSPITAL_COMMUNITY)
Admission: RE | Disposition: A | Discharge status: Home / Self Care | Payer: Self-pay | Source: Home / Self Care | Attending: Orthopedic Surgery

## 2020-10-17 ENCOUNTER — Observation Stay (HOSPITAL_COMMUNITY)
Admission: RE | Admit: 2020-10-17 | Discharge: 2020-10-18 | Disposition: A | Discharge status: Home / Self Care | Payer: Medicare Other | Attending: Orthopedic Surgery | Admitting: Orthopedic Surgery

## 2020-10-17 ENCOUNTER — Ambulatory Visit (HOSPITAL_COMMUNITY): Payer: Medicare Other | Admitting: Anesthesiology

## 2020-10-17 ENCOUNTER — Other Ambulatory Visit: Payer: Self-pay

## 2020-10-17 ENCOUNTER — Encounter (HOSPITAL_COMMUNITY): Payer: Self-pay | Admitting: Orthopedic Surgery

## 2020-10-17 DIAGNOSIS — M1711 Unilateral primary osteoarthritis, right knee: Principal | ICD-10-CM | POA: Insufficient documentation

## 2020-10-17 DIAGNOSIS — G8918 Other acute postprocedural pain: Secondary | ICD-10-CM | POA: Diagnosis not present

## 2020-10-17 DIAGNOSIS — Z79899 Other long term (current) drug therapy: Secondary | ICD-10-CM | POA: Diagnosis not present

## 2020-10-17 DIAGNOSIS — E039 Hypothyroidism, unspecified: Secondary | ICD-10-CM | POA: Diagnosis not present

## 2020-10-17 DIAGNOSIS — N184 Chronic kidney disease, stage 4 (severe): Secondary | ICD-10-CM | POA: Diagnosis not present

## 2020-10-17 DIAGNOSIS — I129 Hypertensive chronic kidney disease with stage 1 through stage 4 chronic kidney disease, or unspecified chronic kidney disease: Secondary | ICD-10-CM | POA: Diagnosis not present

## 2020-10-17 DIAGNOSIS — D631 Anemia in chronic kidney disease: Secondary | ICD-10-CM | POA: Diagnosis not present

## 2020-10-17 DIAGNOSIS — Z96652 Presence of left artificial knee joint: Secondary | ICD-10-CM | POA: Insufficient documentation

## 2020-10-17 DIAGNOSIS — Z96659 Presence of unspecified artificial knee joint: Secondary | ICD-10-CM

## 2020-10-17 HISTORY — PX: TOTAL KNEE ARTHROPLASTY: SHX125

## 2020-10-17 SURGERY — ARTHROPLASTY, KNEE, TOTAL
Anesthesia: Spinal | Site: Knee | Laterality: Right

## 2020-10-17 MED ORDER — OXYCODONE HCL 5 MG PO TABS
5.0000 mg | ORAL_TABLET | ORAL | Status: DC | PRN
  Administered 2020-10-17: 10 mg via ORAL
  Administered 2020-10-17 (×2): 5 mg via ORAL
  Administered 2020-10-18 (×2): 10 mg via ORAL
  Filled 2020-10-17: qty 1
  Filled 2020-10-17: qty 2
  Filled 2020-10-17: qty 1
  Filled 2020-10-17 (×2): qty 2

## 2020-10-17 MED ORDER — MEPIVACAINE HCL (PF) 2 % IJ SOLN
INTRAMUSCULAR | Status: AC
  Filled 2020-10-17: qty 20

## 2020-10-17 MED ORDER — AMISULPRIDE (ANTIEMETIC) 5 MG/2ML IV SOLN: 10.0000 mg | Freq: Once | INTRAVENOUS | Status: DC | PRN

## 2020-10-17 MED ORDER — FENTANYL CITRATE (PF) 100 MCG/2ML IJ SOLN
INTRAMUSCULAR | Status: DC | PRN
  Administered 2020-10-17 (×2): 50 ug via INTRAVENOUS

## 2020-10-17 MED ORDER — LACTATED RINGERS IV SOLN: INTRAVENOUS | Status: DC

## 2020-10-17 MED ORDER — URSODIOL 300 MG PO CAPS
300.0000 mg | ORAL_CAPSULE | Freq: Two times a day (BID) | ORAL | Status: DC
  Administered 2020-10-17 – 2020-10-18 (×2): 300 mg via ORAL
  Filled 2020-10-17 (×2): qty 1

## 2020-10-17 MED ORDER — FERROUS SULFATE 325 (65 FE) MG PO TABS
325.0000 mg | ORAL_TABLET | Freq: Three times a day (TID) | ORAL | Status: DC
  Administered 2020-10-17 – 2020-10-18 (×3): 325 mg via ORAL
  Filled 2020-10-17 (×3): qty 1

## 2020-10-17 MED ORDER — DEXAMETHASONE SODIUM PHOSPHATE 10 MG/ML IJ SOLN
INTRAMUSCULAR | Status: DC | PRN
  Administered 2020-10-17: 6 mg via INTRAVENOUS

## 2020-10-17 MED ORDER — BISACODYL 5 MG PO TBEC: 5.0000 mg | DELAYED_RELEASE_TABLET | Freq: Every day | ORAL | Status: DC | PRN

## 2020-10-17 MED ORDER — PROPOFOL 500 MG/50ML IV EMUL
INTRAVENOUS | Status: DC | PRN
  Administered 2020-10-17: 25 ug/kg/min via INTRAVENOUS

## 2020-10-17 MED ORDER — PROPOFOL 10 MG/ML IV BOLUS
INTRAVENOUS | Status: DC | PRN
  Administered 2020-10-17: 20 mg via INTRAVENOUS

## 2020-10-17 MED ORDER — MEPERIDINE HCL 50 MG/ML IJ SOLN: 6.2500 mg | INTRAMUSCULAR | Status: DC | PRN

## 2020-10-17 MED ORDER — DEXAMETHASONE SODIUM PHOSPHATE 10 MG/ML IJ SOLN: 8.0000 mg | Freq: Once | INTRAMUSCULAR | Status: DC

## 2020-10-17 MED ORDER — DOCUSATE SODIUM 100 MG PO CAPS
100.0000 mg | ORAL_CAPSULE | Freq: Two times a day (BID) | ORAL | Status: DC
  Administered 2020-10-17 – 2020-10-18 (×2): 100 mg via ORAL
  Filled 2020-10-17 (×2): qty 1

## 2020-10-17 MED ORDER — METOCLOPRAMIDE HCL 5 MG PO TABS: 5.0000 mg | ORAL_TABLET | Freq: Three times a day (TID) | ORAL | Status: DC | PRN

## 2020-10-17 MED ORDER — PROPOFOL 10 MG/ML IV BOLUS
INTRAVENOUS | Status: AC
  Filled 2020-10-17: qty 20

## 2020-10-17 MED ORDER — SODIUM BICARBONATE 650 MG PO TABS
650.0000 mg | ORAL_TABLET | Freq: Every day | ORAL | Status: DC
  Administered 2020-10-18: 650 mg via ORAL
  Filled 2020-10-17: qty 1

## 2020-10-17 MED ORDER — SODIUM CHLORIDE 0.9 % IV SOLN: INTRAVENOUS | Status: DC

## 2020-10-17 MED ORDER — DEXAMETHASONE SODIUM PHOSPHATE 10 MG/ML IJ SOLN
INTRAMUSCULAR | Status: AC
  Filled 2020-10-17: qty 1

## 2020-10-17 MED ORDER — PANTOPRAZOLE SODIUM 40 MG PO TBEC
40.0000 mg | DELAYED_RELEASE_TABLET | Freq: Every morning | ORAL | Status: DC
  Administered 2020-10-18: 40 mg via ORAL
  Filled 2020-10-17: qty 1

## 2020-10-17 MED ORDER — CEFAZOLIN SODIUM-DEXTROSE 2-4 GM/100ML-% IV SOLN
2.0000 g | Freq: Four times a day (QID) | INTRAVENOUS | Status: AC
  Administered 2020-10-17 (×2): 2 g via INTRAVENOUS
  Filled 2020-10-17 (×2): qty 100

## 2020-10-17 MED ORDER — PROPOFOL 500 MG/50ML IV EMUL
INTRAVENOUS | Status: AC
  Filled 2020-10-17: qty 50

## 2020-10-17 MED ORDER — LEVOTHYROXINE SODIUM 75 MCG PO TABS
75.0000 ug | ORAL_TABLET | Freq: Every day | ORAL | Status: DC
  Administered 2020-10-18: 75 ug via ORAL
  Filled 2020-10-17: qty 1

## 2020-10-17 MED ORDER — APIXABAN 2.5 MG PO TABS
2.5000 mg | ORAL_TABLET | Freq: Two times a day (BID) | ORAL | Status: DC
  Administered 2020-10-18: 2.5 mg via ORAL
  Filled 2020-10-17: qty 1

## 2020-10-17 MED ORDER — HYDROMORPHONE HCL 1 MG/ML IJ SOLN: 0.5000 mg | INTRAMUSCULAR | Status: DC | PRN

## 2020-10-17 MED ORDER — WATER FOR IRRIGATION, STERILE IR SOLN
Status: DC | PRN
  Administered 2020-10-17: 2000 mL

## 2020-10-17 MED ORDER — POVIDONE-IODINE 10 % EX SWAB
2.0000 "application " | Freq: Once | CUTANEOUS | Status: AC
  Administered 2020-10-17: 2 via TOPICAL

## 2020-10-17 MED ORDER — ZOLPIDEM TARTRATE 5 MG PO TABS: 5.0000 mg | ORAL_TABLET | Freq: Every evening | ORAL | Status: DC | PRN

## 2020-10-17 MED ORDER — MAGNESIUM 250 MG PO TABS: 250.0000 mg | ORAL_TABLET | Freq: Every day | ORAL | Status: DC

## 2020-10-17 MED ORDER — ONDANSETRON HCL 4 MG/2ML IJ SOLN
INTRAMUSCULAR | Status: AC
  Filled 2020-10-17: qty 2

## 2020-10-17 MED ORDER — ONDANSETRON HCL 4 MG/2ML IJ SOLN
INTRAMUSCULAR | Status: DC | PRN
  Administered 2020-10-17: 4 mg via INTRAVENOUS

## 2020-10-17 MED ORDER — ONDANSETRON HCL 4 MG PO TABS: 4.0000 mg | ORAL_TABLET | Freq: Four times a day (QID) | ORAL | Status: DC | PRN

## 2020-10-17 MED ORDER — CHLORHEXIDINE GLUCONATE 0.12 % MT SOLN
15.0000 mL | Freq: Once | OROMUCOSAL | Status: AC
  Administered 2020-10-17: 15 mL via OROMUCOSAL

## 2020-10-17 MED ORDER — BUPIVACAINE LIPOSOME 1.3 % IJ SUSP
INTRAMUSCULAR | Status: DC | PRN
  Administered 2020-10-17: 20 mL

## 2020-10-17 MED ORDER — MAGNESIUM OXIDE -MG SUPPLEMENT 400 (240 MG) MG PO TABS
200.0000 mg | ORAL_TABLET | Freq: Every day | ORAL | Status: DC
  Administered 2020-10-18: 200 mg via ORAL
  Filled 2020-10-17: qty 1

## 2020-10-17 MED ORDER — MIDAZOLAM HCL 2 MG/2ML IJ SOLN
INTRAMUSCULAR | Status: AC
  Filled 2020-10-17: qty 2

## 2020-10-17 MED ORDER — BUPIVACAINE-EPINEPHRINE 0.25% -1:200000 IJ SOLN
INTRAMUSCULAR | Status: DC | PRN
  Administered 2020-10-17: 30 mL

## 2020-10-17 MED ORDER — PHENYLEPHRINE 40 MCG/ML (10ML) SYRINGE FOR IV PUSH (FOR BLOOD PRESSURE SUPPORT)
PREFILLED_SYRINGE | INTRAVENOUS | Status: AC
  Filled 2020-10-17: qty 10

## 2020-10-17 MED ORDER — GABAPENTIN 300 MG PO CAPS
300.0000 mg | ORAL_CAPSULE | Freq: Once | ORAL | Status: AC
  Administered 2020-10-17: 300 mg via ORAL
  Filled 2020-10-17: qty 1

## 2020-10-17 MED ORDER — SENNOSIDES-DOCUSATE SODIUM 8.6-50 MG PO TABS: 1.0000 | ORAL_TABLET | Freq: Every evening | ORAL | Status: DC | PRN

## 2020-10-17 MED ORDER — SODIUM CHLORIDE 0.9% FLUSH
INTRAVENOUS | Status: DC | PRN
  Administered 2020-10-17: 20 mL

## 2020-10-17 MED ORDER — DIPHENHYDRAMINE HCL 12.5 MG/5ML PO ELIX: 12.5000 mg | ORAL_SOLUTION | ORAL | Status: DC | PRN

## 2020-10-17 MED ORDER — PHENYLEPHRINE HCL-NACL 10-0.9 MG/250ML-% IV SOLN
INTRAVENOUS | Status: AC
  Filled 2020-10-17: qty 250

## 2020-10-17 MED ORDER — CEFAZOLIN SODIUM-DEXTROSE 2-4 GM/100ML-% IV SOLN
2.0000 g | INTRAVENOUS | Status: AC
  Administered 2020-10-17: 2 g via INTRAVENOUS
  Filled 2020-10-17: qty 100

## 2020-10-17 MED ORDER — MIDAZOLAM HCL 5 MG/5ML IJ SOLN
INTRAMUSCULAR | Status: DC | PRN
  Administered 2020-10-17: 1 mg via INTRAVENOUS

## 2020-10-17 MED ORDER — HYDROMORPHONE HCL 1 MG/ML IJ SOLN: 0.2500 mg | INTRAMUSCULAR | Status: DC | PRN

## 2020-10-17 MED ORDER — BUPIVACAINE-EPINEPHRINE (PF) 0.25% -1:200000 IJ SOLN
INTRAMUSCULAR | Status: AC
  Filled 2020-10-17: qty 30

## 2020-10-17 MED ORDER — FENTANYL CITRATE (PF) 100 MCG/2ML IJ SOLN
INTRAMUSCULAR | Status: AC
  Filled 2020-10-17: qty 2

## 2020-10-17 MED ORDER — ALUM & MAG HYDROXIDE-SIMETH 200-200-20 MG/5ML PO SUSP: 30.0000 mL | ORAL | Status: DC | PRN

## 2020-10-17 MED ORDER — SODIUM CHLORIDE 0.9 % IR SOLN
Status: DC | PRN
  Administered 2020-10-17: 1000 mL

## 2020-10-17 MED ORDER — SODIUM CHLORIDE (PF) 0.9 % IJ SOLN
INTRAMUSCULAR | Status: AC
  Filled 2020-10-17: qty 20

## 2020-10-17 MED ORDER — PHENYLEPHRINE 40 MCG/ML (10ML) SYRINGE FOR IV PUSH (FOR BLOOD PRESSURE SUPPORT)
PREFILLED_SYRINGE | INTRAVENOUS | Status: DC | PRN
  Administered 2020-10-17: 80 ug via INTRAVENOUS

## 2020-10-17 MED ORDER — PROPOFOL 500 MG/50ML IV EMUL: INTRAVENOUS | Status: DC | PRN

## 2020-10-17 MED ORDER — METHOCARBAMOL 500 MG PO TABS
500.0000 mg | ORAL_TABLET | Freq: Four times a day (QID) | ORAL | Status: DC | PRN
  Filled 2020-10-17: qty 1

## 2020-10-17 MED ORDER — MEPIVACAINE HCL (PF) 2 % IJ SOLN
INTRAMUSCULAR | Status: DC | PRN
  Administered 2020-10-17: 3 mL via INTRATHECAL

## 2020-10-17 MED ORDER — ORAL CARE MOUTH RINSE: 15.0000 mL | Freq: Once | OROMUCOSAL | Status: AC

## 2020-10-17 MED ORDER — PANTOPRAZOLE SODIUM 40 MG PO TBEC: 40.0000 mg | DELAYED_RELEASE_TABLET | Freq: Every day | ORAL | Status: DC

## 2020-10-17 MED ORDER — PROMETHAZINE HCL 25 MG/ML IJ SOLN: 6.2500 mg | INTRAMUSCULAR | Status: DC | PRN

## 2020-10-17 MED ORDER — DEXAMETHASONE SODIUM PHOSPHATE 10 MG/ML IJ SOLN
10.0000 mg | Freq: Once | INTRAMUSCULAR | Status: AC
  Administered 2020-10-18: 10 mg via INTRAVENOUS
  Filled 2020-10-17: qty 1

## 2020-10-17 MED ORDER — ONDANSETRON HCL 4 MG/2ML IJ SOLN
4.0000 mg | Freq: Four times a day (QID) | INTRAMUSCULAR | Status: DC | PRN
  Administered 2020-10-18: 4 mg via INTRAVENOUS
  Filled 2020-10-17: qty 2

## 2020-10-17 MED ORDER — PHENOL 1.4 % MT LIQD: 1.0000 | OROMUCOSAL | Status: DC | PRN

## 2020-10-17 MED ORDER — MENTHOL 3 MG MT LOZG: 1.0000 | LOZENGE | OROMUCOSAL | Status: DC | PRN

## 2020-10-17 MED ORDER — 0.9 % SODIUM CHLORIDE (POUR BTL) OPTIME
TOPICAL | Status: DC | PRN
  Administered 2020-10-17: 1000 mL

## 2020-10-17 MED ORDER — FLEET ENEMA 7-19 GM/118ML RE ENEM: 1.0000 | ENEMA | Freq: Once | RECTAL | Status: DC | PRN

## 2020-10-17 MED ORDER — SERTRALINE HCL 25 MG PO TABS
25.0000 mg | ORAL_TABLET | Freq: Every day | ORAL | Status: DC
  Administered 2020-10-18: 25 mg via ORAL
  Filled 2020-10-17: qty 1

## 2020-10-17 MED ORDER — TRANEXAMIC ACID-NACL 1000-0.7 MG/100ML-% IV SOLN
1000.0000 mg | INTRAVENOUS | Status: AC
  Administered 2020-10-17: 1000 mg via INTRAVENOUS
  Filled 2020-10-17: qty 100

## 2020-10-17 MED ORDER — ROPIVACAINE HCL 7.5 MG/ML IJ SOLN
INTRAMUSCULAR | Status: DC | PRN
  Administered 2020-10-17: 20 mL via PERINEURAL

## 2020-10-17 MED ORDER — METOCLOPRAMIDE HCL 5 MG/ML IJ SOLN
5.0000 mg | Freq: Three times a day (TID) | INTRAMUSCULAR | Status: DC | PRN
  Administered 2020-10-18: 10 mg via INTRAVENOUS
  Filled 2020-10-17: qty 2

## 2020-10-17 MED ORDER — METHOCARBAMOL 500 MG IVPB - SIMPLE MED
500.0000 mg | Freq: Four times a day (QID) | INTRAVENOUS | Status: DC | PRN
  Filled 2020-10-17: qty 50

## 2020-10-17 SURGICAL SUPPLY — 56 items
ARTISURF 10M PLY R 6-9CD KNEE (Knees) ×2 IMPLANT
BAG ZIPLOCK 12X15 (MISCELLANEOUS) ×2 IMPLANT
BLADE SAGITTAL 13X1.27X60 (BLADE) ×2 IMPLANT
BLADE SAW SGTL 18X1.27X75 (BLADE) ×2 IMPLANT
BLADE SAW SGTL 83.5X18.5 (BLADE) ×2 IMPLANT
BLADE SURG 15 STRL LF DISP TIS (BLADE) ×1 IMPLANT
BLADE SURG 15 STRL SS (BLADE) ×1
BLADE SURG SZ10 CARB STEEL (BLADE) ×4 IMPLANT
BNDG ELASTIC 6X5.8 VLCR STR LF (GAUZE/BANDAGES/DRESSINGS) ×2 IMPLANT
BOWL SMART MIX CTS (DISPOSABLE) ×2 IMPLANT
CEMENT BONE SIMPLEX SPEEDSET (Cement) ×4 IMPLANT
CLSR STERI-STRIP ANTIMIC 1/2X4 (GAUZE/BANDAGES/DRESSINGS) ×2 IMPLANT
COVER SURGICAL LIGHT HANDLE (MISCELLANEOUS) ×2 IMPLANT
COVER WAND RF STERILE (DRAPES) IMPLANT
CUFF TOURN SGL QUICK 34 (TOURNIQUET CUFF) ×1
CUFF TRNQT CYL 34X4.125X (TOURNIQUET CUFF) ×1 IMPLANT
DECANTER SPIKE VIAL GLASS SM (MISCELLANEOUS) ×4 IMPLANT
DRAPE INCISE IOBAN 66X45 STRL (DRAPES) ×4 IMPLANT
DRAPE U-SHAPE 47X51 STRL (DRAPES) ×2 IMPLANT
DRSG AQUACEL AG ADV 3.5X10 (GAUZE/BANDAGES/DRESSINGS) ×2 IMPLANT
DURAPREP 26ML APPLICATOR (WOUND CARE) ×4 IMPLANT
ELECT REM PT RETURN 15FT ADLT (MISCELLANEOUS) ×2 IMPLANT
FEMUR  CMT CCR STD SZ7 R KNEE (Knees) ×1 IMPLANT
FEMUR CMT CCR STD SZ7 R KNEE (Knees) ×1 IMPLANT
FEMUR CMTD CCR STD SZ7 R KNEE (Knees) ×1 IMPLANT
GLOVE SURG ENC TEXT LTX SZ7.5 (GLOVE) ×2 IMPLANT
GLOVE SURG ORTHO LTX SZ8 (GLOVE) ×6 IMPLANT
GLOVE SURG UNDER POLY LF SZ7.5 (GLOVE) ×2 IMPLANT
GLOVE SURG UNDER POLY LF SZ8.5 (GLOVE) ×4 IMPLANT
GOWN STRL REUS W/ TWL XL LVL3 (GOWN DISPOSABLE) ×2 IMPLANT
GOWN STRL REUS W/TWL XL LVL3 (GOWN DISPOSABLE) ×2
HANDPIECE INTERPULSE COAX TIP (DISPOSABLE) ×1
HOLDER FOLEY CATH W/STRAP (MISCELLANEOUS) ×2 IMPLANT
HOOD PEEL AWAY FLYTE STAYCOOL (MISCELLANEOUS) ×6 IMPLANT
KIT TURNOVER KIT A (KITS) ×2 IMPLANT
MANIFOLD NEPTUNE II (INSTRUMENTS) ×2 IMPLANT
NEEDLE HYPO 22GX1.5 SAFETY (NEEDLE) ×2 IMPLANT
NS IRRIG 1000ML POUR BTL (IV SOLUTION) ×2 IMPLANT
PACK TOTAL KNEE CUSTOM (KITS) ×2 IMPLANT
PENCIL SMOKE EVACUATOR (MISCELLANEOUS) ×2 IMPLANT
PROTECTOR NERVE ULNAR (MISCELLANEOUS) ×2 IMPLANT
SET HNDPC FAN SPRY TIP SCT (DISPOSABLE) ×1 IMPLANT
STEM POLY PAT PLY 32M KNEE (Knees) ×2 IMPLANT
STEM TIBIA 5 DEG SZ D R KNEE (Knees) ×1 IMPLANT
STRIP CLOSURE SKIN 1/2X4 (GAUZE/BANDAGES/DRESSINGS) ×2 IMPLANT
SUT BONE WAX W31G (SUTURE) ×2 IMPLANT
SUT MNCRL AB 3-0 PS2 18 (SUTURE) ×2 IMPLANT
SUT STRATAFIX 0 PDS 27 VIOLET (SUTURE) ×2
SUT STRATAFIX PDS+ 0 24IN (SUTURE) ×2 IMPLANT
SUT VIC AB 1 CT1 36 (SUTURE) ×2 IMPLANT
SUTURE STRATFX 0 PDS 27 VIOLET (SUTURE) ×1 IMPLANT
SYR 30ML LL (SYRINGE) ×4 IMPLANT
TIBIA STEM 5 DEG SZ D R KNEE (Knees) ×2 IMPLANT
TRAY FOLEY MTR SLVR 16FR STAT (SET/KITS/TRAYS/PACK) ×2 IMPLANT
WATER STERILE IRR 1000ML POUR (IV SOLUTION) ×4 IMPLANT
WRAP KNEE MAXI GEL POST OP (GAUZE/BANDAGES/DRESSINGS) ×2 IMPLANT

## 2020-10-17 NOTE — Progress Notes (Signed)
Orthopedic Tech Progress Note Patient Details:  Jill Fletcher 08-25-1940 [id number]  CPM Right Knee CPM Right Knee: On Right Knee Flexion (Degrees): 90 Right Knee Extension (Degrees): 0  Post Interventions Patient Tolerated: Well Ortho Devices Type of Ortho Device: Bone foam zero knee Ortho Device/Splint Interventions: Ordered   Post Interventions Patient Tolerated: Well   Linus Salmons [place] Chimenti 10/17/2020, 9:42 AM

## 2020-10-17 NOTE — Plan of Care (Signed)
  Problem: Activity: Goal: Risk for activity intolerance will decrease Outcome: Progressing   Problem: Nutrition: Goal: Adequate nutrition will be maintained Outcome: Progressing   Problem: Elimination: Goal: Will not experience complications related to bowel motility Outcome: Progressing   Problem: Pain Managment: Goal: General experience of comfort will improve Outcome: Progressing   Problem: Education: Goal: Knowledge of the prescribed therapeutic regimen will improve Outcome: Progressing   Problem: Activity: Goal: Ability to avoid complications of mobility impairment will improve Outcome: Progressing   Problem: Pain Management: Goal: Pain level will decrease with appropriate interventions Outcome: Progressing

## 2020-10-17 NOTE — Evaluation (Signed)
Physical Therapy Evaluation Patient Details Name: Jill Fletcher MRN: [id number] DOB: Apr 06, 1941 Today's Date: 10/17/2020   History of Present Illness  Jill Fletcher is a 80 yo female s/p R TKA. PMH: fibromyalgia, chronic renal disease, HTN, L TKA 2017  Clinical Impression  Pt is s/p R TKA resulting in the deficits listed below (see PT Problem List). Pt is POD0 R TKA, currently requiring min A with transfers and ambulation of 30 ft using RW. Pt ambulates without R knee buckling noted, requiring increased time to complete. Pt with 8/10 R knee pain despite premedicating, additional pain medication given at EOS and ice reapplied. Educated pt on ankle pumps and performing additional sets while up in chair and pt verbalized understanding. Pt with good family support and ramp to enter home, motivated to d/c home tomorrow with family support pending progress with PT. Pt will benefit from skilled PT to increase their independence and safety with mobility to allow discharge to the venue listed below.      Follow Up Recommendations Follow surgeon's recommendation for DC plan and follow-up therapies    Equipment Recommendations  3in1 (PT)    Recommendations for Other Services       Precautions / Restrictions Precautions Precautions: Fall Restrictions Weight Bearing Restrictions: No      Mobility  Bed Mobility Overal bed mobility: Needs Assistance Bed Mobility: Supine to Sit  Supine to sit: Supervision  General bed mobility comments: UEs self assisting RLE over to EOB, increased time    Transfers Overall transfer level: Needs assistance Equipment used: Rolling walker (2 wheeled) Transfers: Sit to/from Stand Sit to Stand: Min assist    General transfer comment: VCs for RLE positioning and hand placement to power up and return to sitting, increased time and min A to steady  Ambulation/Gait Ambulation/Gait assistance: Min assist Gait Distance (Feet): 30 Feet Assistive device: Rolling  walker (2 wheeled) Gait Pattern/deviations: Step-to pattern;Decreased stance time - right;Decreased stride length Gait velocity: decreased   General Gait Details: pt able to perform pre-gait weight-shifting and standing marching without knee buckling, step to pattern with decreased RLE stance time, no knee buckling or LOB  Stairs            Wheelchair Mobility    Modified Rankin (Stroke Patients Only)       Balance Overall balance assessment: Needs assistance Sitting-balance support: Feet supported Sitting balance-Leahy Scale: Good Sitting balance - Comments: seated EOB   Standing balance support: During functional activity;Bilateral upper extremity supported Standing balance-Leahy Scale: Poor Standing balance comment: reliant on UE support         Pertinent Vitals/Pain Pain Assessment: 0-10 Pain Score: 8  Pain Location: R knee Pain Descriptors / Indicators: Aching;Sore;Tender Pain Intervention(s): Limited activity within patient's tolerance;Monitored during session;Premedicated before session;Repositioned;Patient requesting pain meds-RN notified;Ice applied    Home Living Family/patient expects to be discharged to:: Private residence Living Arrangements: Other relatives (grandson) Available Help at Discharge: Family;Available 24 hours/day Type of Home: House Home Access: Ramped entrance     Home Layout: One level Home Equipment: Walker - 2 wheels;Walker - 4 wheels;Shower seat - built in Additional Comments: pt lives with grandson; daughter and son in law live across the street and will be staying with pt while grandson is at work    Prior Function Level of Independence: Independent  Comments: pt reports independent with community ambulation, ADLs/IADLs, drives.     Hand Dominance        Extremity/Trunk Assessment   Upper Extremity Assessment  Upper Extremity Assessment: Overall WFL for tasks assessed    Lower Extremity Assessment Lower Extremity  Assessment: RLE deficits/detail RLE Deficits / Details: good quad set, SLR without extensor lag x3 reps, ankle and hip WFL    Cervical / Trunk Assessment Cervical / Trunk Assessment: Normal  Communication   Communication: No difficulties  Cognition Arousal/Alertness: Awake/alert Behavior During Therapy: WFL for tasks assessed/performed Overall Cognitive Status: Within Functional Limits for tasks assessed     General Comments General comments (skin integrity, edema, etc.): on RA with SpO2 >92% throughout eval    Exercises Total Joint Exercises Ankle Circles/Pumps: Seated;AROM;Both;20 reps Quad Sets: Supine;Strengthening;Right;5 reps   Assessment/Plan    PT Assessment Patient needs continued PT services  PT Problem List Decreased strength;Decreased range of motion;Decreased activity tolerance;Decreased balance;Decreased mobility;Pain       PT Treatment Interventions DME instruction;Gait training;Functional mobility training;Therapeutic activities;Therapeutic exercise;Balance training;Neuromuscular re-education;Patient/family education    PT Goals (Current goals can be found in the Care Plan section)  Acute Rehab PT Goals Patient Stated Goal: back to independent PT Goal Formulation: With patient Time For Goal Achievement: 10/31/20 Potential to Achieve Goals: Good    Frequency 7X/week   Barriers to discharge        Co-evaluation               AM-PAC PT "6 Clicks" Mobility  Outcome Measure Help needed turning from your back to your side while in a flat bed without using bedrails?: A Little Help needed moving from lying on your back to sitting on the side of a flat bed without using bedrails?: A Little Help needed moving to and from a bed to a chair (including a wheelchair)?: A Little Help needed standing up from a chair using your arms (e.g., wheelchair or bedside chair)?: A Little Help needed to walk in hospital room?: A Little Help needed climbing 3-5 steps with  a railing? : A Lot 6 Click Score: 17    End of Session Equipment Utilized During Treatment: Gait belt Activity Tolerance: Patient tolerated treatment well;Patient limited by pain Patient left: in chair;with call bell/phone within reach;with chair alarm set Nurse Communication: Mobility status;Patient requests pain meds PT Visit Diagnosis: Other abnormalities of gait and mobility (R26.89);Difficulty in walking, not elsewhere classified (R26.2);Pain Pain - Right/Left: Right Pain - part of body: Knee    Time: 1330-1405 PT Time Calculation (min) (ACUTE ONLY): 35 min   Charges:   PT Evaluation $PT Eval Low Complexity: 1 Low PT Treatments $Gait Training: 8-22 mins         Tori Michaelia Beilfuss PT, DPT 10/17/20, 3:54 PM

## 2020-10-17 NOTE — Anesthesia Procedure Notes (Signed)
Anesthesia Regional Block: Adductor canal block   Pre-Anesthetic Checklist: ,, timeout performed, Correct Patient, Correct Site, Correct Laterality, Correct Procedure, Correct Position, site marked, Risks and benefits discussed,  Surgical consent,  Pre-op evaluation,  At surgeon's request and post-op pain management  Laterality: Right  Prep: chloraprep       Needles:  Injection technique: Single-shot  Needle Type: Echogenic Stimulator Needle     Needle Length: 9cm  Needle Gauge: 21     Additional Needles:   Procedures:,,,, ultrasound used (permanent image in chart),,,,  Narrative:  Start time: 10/17/2020 7:00 AM End time: 10/17/2020 7:05 AM Injection made incrementally with aspirations every 5 mL.  Performed by: Personally  Anesthesiologist: Effie Berkshire, MD  Additional Notes: Patient tolerated the procedure well. Local anesthetic introduced in an incremental fashion under minimal resistance after negative aspirations. No paresthesias were elicited. After completion of the procedure, no acute issues were identified and patient continued to be monitored by RN.

## 2020-10-17 NOTE — Anesthesia Postprocedure Evaluation (Signed)
Anesthesia Post Note  Patient: Jill Fletcher  Procedure(s) Performed: TOTAL KNEE ARTHROPLASTY (Right Knee)     Patient location during evaluation: PACU Anesthesia Type: Spinal Level of consciousness: oriented and awake and alert Pain management: pain level controlled Vital Signs Assessment: post-procedure vital signs reviewed and stable Respiratory status: spontaneous breathing, respiratory function stable and patient connected to nasal cannula oxygen Cardiovascular status: blood pressure returned to baseline and stable Postop Assessment: no headache, no backache, no apparent nausea or vomiting and spinal receding Anesthetic complications: no   No complications documented.  Last Vitals:  Vitals:   10/17/20 1106 10/17/20 1247  BP: (!) 148/69 (!) 144/72  Pulse: 74 74  Resp: 18 18  Temp: 36.5 C (!) 36.3 C  SpO2: 100%     Last Pain:  Vitals:   10/17/20 1403  TempSrc:   PainSc: [place]

## 2020-10-17 NOTE — Discharge Instructions (Signed)
Information on my medicine - ELIQUIS (apixaban)  This medication education was reviewed with me or my healthcare representative as part of my discharge preparation.  The pharmacist that spoke with me during my hospital stay was:    Why was Eliquis prescribed for you? Eliquis was prescribed for you to reduce the risk of blood clots forming after orthopedic surgery.    What do You need to know about Eliquis? Take your Eliquis TWICE DAILY - one tablet in the morning and one tablet in the evening with or without food.  It would be best to take the dose about the same time each day.  If you have difficulty swallowing the tablet whole please discuss with your pharmacist how to take the medication safely.  Take Eliquis exactly as prescribed by your doctor and DO NOT stop taking Eliquis without talking to the doctor who prescribed the medication.  Stopping without other medication to take the place of Eliquis may increase your risk of developing a clot.  After discharge, you should have regular check-up appointments with your healthcare provider that is prescribing your Eliquis.  What do you do if you miss a dose? If a dose of ELIQUIS is not taken at the scheduled time, take it as soon as possible on the same day and twice-daily administration should be resumed.  The dose should not be doubled to make up for a missed dose.  Do not take more than one tablet of ELIQUIS at the same time.  Important Safety Information A possible side effect of Eliquis is bleeding. You should call your healthcare provider right away if you experience any of the following: Bleeding from an injury or your nose that does not stop. Unusual colored urine (red or dark brown) or unusual colored stools (red or black). Unusual bruising for unknown reasons. A serious fall or if you hit your head (even if there is no bleeding).  Some medicines may interact with Eliquis and might increase your risk of bleeding or  clotting while on Eliquis. To help avoid this, consult your healthcare provider or pharmacist prior to using any new prescription or non-prescription medications, including herbals, vitamins, non-steroidal anti-inflammatory drugs (NSAIDs) and supplements.  This website has more information on Eliquis (apixaban): http://www.eliquis.com/eliquis/home      Dr. Brian Swinteck Joint Replacement Specialist [facility] [address] [place], [place] [address] [phone]   TOTAL HIP REPLACEMENT POSTOPERATIVE DIRECTIONS    Hip Rehabilitation, Guidelines Following Surgery   WEIGHT BEARING Weight bearing as tolerated with assist device (walker, cane, etc) as directed, use it as long as suggested by your surgeon or therapist, typically at least 4-6 weeks.  The results of a hip operation are greatly improved after range of motion and muscle strengthening exercises. Follow all safety measures which are given to protect your hip. If any of these exercises cause increased pain or swelling in your joint, decrease the amount until you are comfortable again. Then slowly increase the exercises. Call your caregiver if you have problems or questions.   HOME CARE INSTRUCTIONS  Most of the following instructions are designed to prevent the dislocation of your new hip.  Remove items at home which could result in a fall. This includes throw rugs or furniture in walking pathways.  Continue medications as instructed at time of discharge. You may have some home medications which will be placed on hold until you complete the course of blood thinner medication. You may start showering once you are discharged home.   Do not remove your dressing. Do not put on socks or shoes without following the instructions of your caregivers.   Sit on chairs with arms. Use the chair arms to help push yourself up when arising.  Arrange for the use of a toilet seat elevator so you are not sitting low.   Walk with walker as instructed.  You may resume a sexual relationship in one month or when given the OK by your caregiver.  Use walker as long as suggested by your caregivers.  You may put full weight on your legs and walk as much as is comfortable. Avoid periods of inactivity such as sitting longer than an hour when not asleep. This helps prevent blood clots.  You may return to work once you are cleared by your surgeon.  Do not drive a car for 6 weeks or until released by your surgeon.  Do not drive while taking narcotics.  Wear elastic stockings for two weeks following surgery during the day but you may remove then at night.  Make sure you keep all of your appointments after your operation with all of your doctors and caregivers. You should call the office at the above phone number and make an appointment for approximately two weeks after the date of your surgery. Please pick up a stool softener and laxative for home use as long as you are requiring pain medications. ICE to the affected hip every three hours for 30 minutes at a time and then as needed for pain and swelling. Continue to use ice on the hip for pain and swelling from surgery. You may notice swelling that will progress down to the foot and ankle.  This is normal after surgery.  Elevate the leg when you are not up walking on it.   It is important for you to complete the blood thinner medication as prescribed by your doctor. Continue to use the breathing machine which will help keep your temperature down.  It is common for your temperature to cycle up and down following surgery, especially at night when you are not up moving around and exerting yourself.  The breathing machine keeps your lungs expanded and your temperature down.  RANGE OF MOTION AND STRENGTHENING EXERCISES  These exercises are designed to help you keep full movement of your hip joint. Follow your caregiver's or physical therapist's instructions. Perform all exercises  about fifteen times, three times per day or as directed. Exercise both hips, even if you have had only one joint replacement. These exercises can be done on a training (exercise) mat, on the floor, on a table or on a bed. Use whatever works the best and is most comfortable for you. Use music or television while you are exercising so that the exercises are a pleasant break in your day. This will make your life better with the exercises acting as a break in routine you can look forward to.  Lying on your back, slowly slide your foot toward your buttocks, raising your knee up off the floor. Then slowly slide your foot back down until your leg is straight again.  Lying on your back spread your legs as far apart as you can without causing discomfort.  Lying on your side, raise your upper leg and foot straight up from the floor as far as is comfortable. Slowly lower the leg and repeat.  Lying on your back, tighten up the muscle in the front of your thigh (quadriceps muscles). You can do this by keeping your   leg straight and trying to raise your heel off the floor. This helps strengthen the largest muscle supporting your knee.  Lying on your back, tighten up the muscles of your buttocks both with the legs straight and with the knee bent at a comfortable angle while keeping your heel on the floor.   SKILLED REHAB INSTRUCTIONS: If the patient is transferred to a skilled rehab facility following release from the hospital, a list of the current medications will be sent to the facility for the patient to continue.  When discharged from the skilled rehab facility, please have the facility set up the patient's Home Health Physical Therapy prior to being released. Also, the skilled facility will be responsible for providing the patient with their medications at time of release from the facility to include their pain medication and their blood thinner medication. If the patient is still at the rehab facility at time of the  two week follow up appointment, the skilled rehab facility will also need to assist the patient in arranging follow up appointment in our office and any transportation needs.  POST-OPERATIVE OPIOID TAPER INSTRUCTIONS: It is important to wean off of your opioid medication as soon as possible. If you do not need pain medication after your surgery it is ok to stop day one. Opioids include: Codeine, Hydrocodone(Norco, Vicodin), Oxycodone(Percocet, oxycontin) and hydromorphone amongst others.  Long term and even short term use of opiods can cause: Increased pain response Dependence Constipation Depression Respiratory depression And more.  Withdrawal symptoms can include Flu like symptoms Nausea, vomiting And more Techniques to manage these symptoms Hydrate well Eat regular healthy meals Stay active Use relaxation techniques(deep breathing, meditating, yoga) Do Not substitute Alcohol to help with tapering If you have been on opioids for less than two weeks and do not have pain than it is ok to stop all together.  Plan to wean off of opioids This plan should start within one week post op of your joint replacement. Maintain the same interval or time between taking each dose and first decrease the dose.  Cut the total daily intake of opioids by one tablet each day Next start to increase the time between doses. The last dose that should be eliminated is the evening dose.    MAKE SURE YOU:  Understand these instructions.  Will watch your condition.  Will get help right away if you are not doing well or get worse.  Pick up stool softner and laxative for home use following surgery while on pain medications. Do not remove your dressing. The dressing is waterproof--it is OK to take showers. Continue to use ice for pain and swelling after surgery. Do not use any lotions or creams on the incision until instructed by your surgeon. Total Hip Protocol.  

## 2020-10-17 NOTE — Anesthesia Preprocedure Evaluation (Addendum)
Anesthesia Evaluation  Patient identified by MRN, date of birth, ID band Patient awake    Reviewed: Allergy & Precautions, NPO status , Patient's Chart, lab work & pertinent test results  History of Anesthesia Complications (+) PONV and history of anesthetic complications  Airway Mallampati: II  TM Distance: >3 FB Neck ROM: Full    Dental  (+) Partial Upper, Implants   Pulmonary neg pulmonary ROS,    Pulmonary exam normal        Cardiovascular hypertension,  Rhythm:Regular Rate:Normal     Neuro/Psych negative neurological ROS  negative psych ROS   GI/Hepatic GERD  ,(+) Hepatitis -  Endo/Other  Hypothyroidism   Renal/GU Renal disease     Musculoskeletal  (+) Arthritis , Fibromyalgia -  Abdominal Normal abdominal exam  (+)   Peds  Hematology  (+) anemia ,   Anesthesia Other Findings   Reproductive/Obstetrics                            Anesthesia Physical Anesthesia Plan  ASA: III  Anesthesia Plan: Spinal   Post-op Pain Management:  Regional for Post-op pain   Induction: Intravenous  PONV Risk Score and Plan: 4 or greater and Ondansetron, Dexamethasone, Propofol infusion and Treatment may vary due to age or medical condition  Airway Management Planned: Natural Airway and Simple Face Mask  Additional Equipment: None  Intra-op Plan:   Post-operative Plan:   Informed Consent: I have reviewed the patients History and Physical, chart, labs and discussed the procedure including the risks, benefits and alternatives for the proposed anesthesia with the patient or authorized representative who has indicated his/her understanding and acceptance.       Plan Discussed with: CRNA  Anesthesia Plan Comments: (Lab Results      Component                Value               Date                      WBC                      6.7                 10/07/2020                HGB                       12.7                10/07/2020                HCT                      38.9                10/07/2020                MCV                      100.0               10/07/2020                PLT  241                 10/07/2020            Lab Results      Component                Value               Date                      INR                      0.87                08/05/2012           )       Anesthesia Quick Evaluation

## 2020-10-17 NOTE — Anesthesia Procedure Notes (Signed)
Date/Time: 10/17/2020 7:21 AM Performed by: Sharlette Dense, CRNA Oxygen Delivery Method: Simple face mask

## 2020-10-17 NOTE — Op Note (Signed)
TOTAL KNEE REPLACEMENT OPERATIVE NOTE:  10/17/2020  9:27 AM  PATIENT:  Jill Fletcher  80 y.o. female  PRE-OPERATIVE DIAGNOSIS:  Osteoarthritis of right knee M17.11  POST-OPERATIVE DIAGNOSIS:  Osteoarthritis of right knee M17.11  PROCEDURE:  Procedure(s): TOTAL KNEE ARTHROPLASTY  SURGEON:  Surgeon(s): Vickey Huger, MD  PHYSICIAN ASSISTANT: Carlyon Shadow, PA-C   ANESTHESIA:   spinal  SPECIMEN: None  COUNTS:  Correct  TOURNIQUET:   Total Tourniquet Time Documented: Thigh (Right) - 39 minutes Total: Thigh (Right) - 39 minutes   DICTATION:  Indication for procedure:    The patient is a 80 y.o. female who has failed conservative treatment for Osteoarthritis of right knee M17.11.  Informed consent was obtained prior to anesthesia. The risks versus benefits of the operation were explain and in a way the patient can, and did, understand.    Description of procedure:     The patient was taken to the operating room and placed under anesthesia.  The patient was positioned in the usual fashion taking care that all body parts were adequately padded and/or protected.  A tourniquet was applied and the leg prepped and draped in the usual sterile fashion.  The extremity was exsanguinated with the esmarch and tourniquet inflated to 350 mmHg.  Pre-operative range of motion was normal.  The knee was in 3 degree of mild valgus.  A midline incision approximately 6-7 inches long was made with a #10 blade.  A new blade was used to make a parapatellar arthrotomy going 2-3 cm into the quadriceps tendon, over the patella, and alongside the medial aspect of the patellar tendon.  A synovectomy was then performed with the #10 blade and forceps. I then elevated the deep MCL off the medial tibial metaphysis subperiosteally around to the semimembranosus attachment.    I everted the patella and used calipers to measure patellar thickness.  I used the reamer to ream down to appropriate thickness to recreate  the native thickness.  I then removed excess bone with the rongeur and sagittal saw.  I used the appropriately sized template and drilled the three lug holes.  I then put the trial in place and measured the thickness with the calipers to ensure recreation of the native thickness.  The trial was then removed and the patella subluxed and the knee brought into flexion.  A homan retractor was place to retract and protect the patella and lateral structures.  A Z-retractor was place medially to protect the medial structures.  The extra-medullary alignment system was used to make cut the tibial articular surface perpendicular to the anamotic axis of the tibia and in 3 degrees of posterior slope.  The cut surface and alignment jig was removed.  I then used the intramedullary alignment guide to make a valgus cut on the distal femur.  I then marked out the epicondylar axis on the distal femur. I then used the anterior referencing sizer and measured the femur to be a size 7.  The 4-In-1 cutting block was screwed into place in external rotation matching the posterior condylar angle, making our cuts perpendicular to the epicondylar axis.  Anterior, posterior and chamfer cuts were made with the sagittal saw.  The cutting block and cut pieces were removed.  A lamina spreader was placed in 90 degrees of flexion.  The ACL, PCL, menisci, and posterior condylar osteophytes were removed.  A 10 mm spacer blocked was found to offer good flexion and extension gap balance after minimal in degree releasing.  The scoop retractor was then placed and the femoral finishing block was pinned in place.  The small sagittal saw was used as well as the lug drill to finish the femur.  The block and cut surfaces were removed and the medullary canal hole filled with autograft bone from the cut pieces.  The tibia was delivered forward in deep flexion and external rotation.  A size D tray was selected and pinned into place centered on the medial  1/3 of the tibial tubercle.  The reamer and keel was used to prepare the tibia through the tray.    I then trialed with the size 7 femur, size D tibia, a 10 mm insert and the 32 patella.  I had excellent flexion/extension gap balance, excellent patella tracking.  Flexion was full and beyond 120 degrees; extension was zero.  These components were chosen and the staff opened them to me on the back table while the knee was lavaged copiously and the cement mixed.  The soft tissue was infiltrated with 60cc of exparel 1.3% through a 21 gauge needle.  I cemented in the components and removed all excess cement.  The polyethylene tibial component was snapped into place and the knee placed in extension while cement was hardening.  The capsule was infilltrated with a 60cc exparel/marcaine/saline mixture.   Once the cement was hard, the tourniquet was let down.  Hemostasis was obtained.  The arthrotomy was closed using a #1 stratofix running suture.  The deep soft tissues were closed with #0 vicryls and the subcuticular layer closed with #2-0 vicryl.  The skin was reapproximated and closed with 3.0 Monocryl.  The wound was covered with steristrips, aquacel dressing, and a TED stocking.   The patient was then awakened, extubated, and taken to the recovery room in stable condition.  BLOOD LOSS:  0000000 COMPLICATIONS:  None.  PLAN OF CARE: Admit for overnight observation  PATIENT DISPOSITION:  PACU - hemodynamically stable.    Please fax a copy of this op note to my office at [phone] (please only include page 1 and 2 of the Case Information op note)

## 2020-10-17 NOTE — Anesthesia Procedure Notes (Signed)
Spinal  Start time: 10/17/2020 7:23 AM End time: 10/17/2020 7:25 AM Reason for block: surgical anesthesia Staffing Performed: anesthesiologist  Anesthesiologist: Effie Berkshire, MD Preanesthetic Checklist Completed: patient identified, IV checked, site marked, risks and benefits discussed, surgical consent, monitors and equipment checked, pre-op evaluation and timeout performed Spinal Block Patient position: sitting Prep: DuraPrep and site prepped and draped Location: L3-4 Injection technique: single-shot Needle Needle type: Pencan  Needle gauge: 24 G Needle length: 10 cm Needle insertion depth: 10 cm Additional Notes Patient tolerated well. No immediate complications.

## 2020-10-17 NOTE — Transfer of Care (Signed)
Immediate Anesthesia Transfer of Care Note  Patient: Jill Fletcher  Procedure(s) Performed: TOTAL KNEE ARTHROPLASTY (Right Knee)  Patient Location: PACU  Anesthesia Type:Spinal  Level of Consciousness: awake and alert   Airway & Oxygen Therapy: Patient Spontanous Breathing and Patient connected to face mask oxygen  Post-op Assessment: Report given to RN and Post -op Vital signs reviewed and stable  Post vital signs: Reviewed and stable  Last Vitals:  Vitals Value Taken Time  BP 124/46 10/17/20 0854  Temp    Pulse 73 10/17/20 0856  Resp 11 10/17/20 0856  SpO2 100 % 10/17/20 0856  Vitals shown include unvalidated device data.  Last Pain:  Vitals:   10/17/20 0602  TempSrc: Oral         Complications: No complications documented.

## 2020-10-17 NOTE — H&P (Signed)
Jill Fletcher MRN:  [id number] DOB/SEX:  11-12-1940/female  CHIEF COMPLAINT:  Painful right Knee  HISTORY: Patient is a 80 y.o. female presented with a history of pain in the right knee. Onset of symptoms was gradual starting a few days ago with gradually worsening course since that time. Patient has been treated conservatively with over-the-counter NSAIDs and activity modification. Patient currently rates pain in the knee at 10 out of 10 with activity. There is pain at night.  PAST MEDICAL HISTORY: Patient Active Problem List   Diagnosis Date Noted  . Stress incontinence of urine 07/10/2017  . Arthritis 07/08/2017  . Biliary cirrhosis ([place]) 07/08/2017  . Fibromyalgia 07/08/2017  . Vaginal vault prolapse 05/30/2017  . Midline cystocele 04/03/2017  . Tenosynovitis of left ankle 01/24/2017  . Acute left ankle pain 01/10/2017  . Chronic renal disease, stage 3, moderately decreased glomerular filtration rate between 30-59 mL/min/1.73 square meter ([place]) 08/29/2016  . Acquired hypothyroidism 06/29/2016  . Essential hypertension 06/29/2016  . Nocturia 06/29/2016  . OAB (overactive bladder) 06/29/2016  . Overweight (BMI 25.0-29.9) 08/13/2012  . Hyponatremia 08/13/2012  . S/P left TKA 08/12/2012   Past Medical History:  Diagnosis Date  . Anemia   . Chronic kidney disease    Stage IV  . Fibromyalgia   . GERD (gastroesophageal reflux disease)   . Hypercholesteremia   . Hypertension   . Hypothyroidism   . Osteoarthritis of spine   . PONV (postoperative nausea and vomiting)    yrs ago, 08-03-2004 back xray on chart per dr Alvan Dame request  . Primary biliary cirrhosis [facility])    Past Surgical History:  Procedure Laterality Date  . ABDOMINAL HYSTERECTOMY    . APPENDECTOMY    . bladder tach  yrs ago  . BUNIONECTOMY Bilateral yrs ago  . CHOLECYSTECTOMY    . hysterectomy----unknown    . lumbar surgery ----- unknown    . MASTOIDECTOMY    . right shoulder surgery  yrs ago  . TONSILLECTOMY     . TOTAL KNEE ARTHROPLASTY Left 08/12/2012   Procedure: TOTAL KNEE ARTHROPLASTY;  Surgeon: Mauri Pole, MD;  Location: [facility]S;  Service: Orthopedics;  Laterality: Left;     MEDICATIONS:   Medications Prior to Admission  Medication Sig Dispense Refill Last Dose  . acetaminophen (TYLENOL) 500 MG tablet Take 500 mg by mouth daily as needed for moderate pain.   10/16/2020 at Unknown time  . Biotin 5 MG CAPS Take 5 mg by mouth daily.   10/16/2020 at Unknown time  . calcium carbonate (TUMS - DOSED IN MG ELEMENTAL CALCIUM) 500 MG chewable tablet Chew 500 mg by mouth daily as needed for indigestion or heartburn.   10/16/2020 at Unknown time  . Cholecalciferol (VITAMIN D3) 1000 UNITS CAPS Take 2,000 Units by mouth daily.   10/16/2020 at Unknown time  . Coenzyme Q10 200 MG capsule Take 200 mg by mouth daily.   10/16/2020 at Unknown time  . Cyanocobalamin (B-12) 500 MCG SUBL Place 500 mcg under the tongue daily.   10/16/2020 at Unknown time  . diphenhydrAMINE-zinc acetate (BENADRYL) cream Apply 1 application topically 3 (three) times daily as needed for itching.   10/16/2020 at Unknown time  . docusate sodium 100 MG CAPS Take 100 mg by mouth 2 (two) times daily. (Patient taking differently: Take 100 mg by mouth 2 (two) times daily as needed (constipation).) 10 capsule  10/16/2020 at Unknown time  . estradiol (VIVELLE-DOT) 0.05 MG/24HR Place 1 patch onto the skin 2 (  two) times a week.   Past Week at Unknown time  . hydrocortisone cream 1 % Apply 1 application topically 2 (two) times daily as needed for itching.   Past Week at Unknown time  . levothyroxine (SYNTHROID, LEVOTHROID) 75 MCG tablet Take 75 mcg by mouth daily before breakfast.    10/17/2020 at 0100  . Magnesium 250 MG TABS Take 250 mg by mouth daily.   10/16/2020 at Unknown time  . milk thistle 175 MG tablet Take 175 mg by mouth daily.   10/16/2020 at Unknown time  . nystatin (MYCOSTATIN/NYSTOP) powder Apply 1 application topically daily.   10/16/2020 at Unknown time   . pantoprazole (PROTONIX) 40 MG tablet Take 40 mg by mouth every morning.    10/17/2020 at 0100  . polyethylene glycol (MIRALAX / GLYCOLAX) packet Take 17 g by mouth 2 (two) times daily. (Patient taking differently: Take 17 g by mouth daily.) 14 each  10/16/2020 at Unknown time  . Polyvinyl Alcohol-Povidone (REFRESH OP) Place 1 drop into both eyes daily as needed (dryness).   10/16/2020 at Unknown time  . pyridOXINE (VITAMIN B-6) 100 MG tablet Take 100 mg by mouth daily.   10/16/2020 at Unknown time  . sertraline (ZOLOFT) 25 MG tablet Take 25 mg by mouth daily.   10/17/2020 at 0100  . sodium bicarbonate 650 MG tablet Take 650 mg by mouth daily.   10/16/2020 at Unknown time  . ursodiol (ACTIGALL) 500 MG tablet Take 500 mg by mouth 2 (two) times daily.   10/16/2020 at Unknown time    ALLERGIES:   Allergies  Allergen Reactions  . Tylenol [Acetaminophen]     Patient has Primary Biliary Cirrhosis   . Penicillins Itching, Rash and Other (See Comments)    Whelps at injections site and itching.  Patient states she tolerates Ceftin.   Rapid heartbeat   . Levaquin [Levofloxacin] Itching    Heart pounding  . Macrobid [Nitrofurantoin] Itching    Heat pounding   . Nsaids     Stage 4 kidney disease   . Other Itching    ALL MYCINS & CILLINS AND CYCLINS-CANT SLEEP AND HEART POUNDING  . Codeine Palpitations  . Doxycycline Rash    And like medications. Heart pappitations and itching     REVIEW OF SYSTEMS:  A comprehensive review of systems was negative except for: Musculoskeletal: positive for arthralgias and bone pain   FAMILY HISTORY:   Family History  Problem Relation Age of Onset  . Diabetes Father   . Hypertension Mother   . Arthritis Mother   . Diabetes Brother     SOCIAL HISTORY:   Social History   Tobacco Use  . Smoking status: Never Smoker  . Smokeless tobacco: Never Used  Substance Use Topics  . Alcohol use: No     EXAMINATION:  Vital signs in last 24 hours: Temp:  [98.5 F  (36.9 C)] 98.5 F (36.9 C) (06/06 0602) Pulse Rate:  [71] 71 (06/06 0602) Resp:  [16] 16 (06/06 0602) BP: (151)/(60) 151/60 (06/06 0602) SpO2:  [100 %] 100 % (06/06 0602)  BP (!) 151/60   Pulse 71   Temp 98.5 F (36.9 C) (Oral)   Resp 16   SpO2 100%   General Appearance:    Alert, cooperative, no distress, appears stated age  Head:    Normocephalic, without obvious abnormality, atraumatic  Eyes:    PERRL, conjunctiva/corneas clear, EOM's intact, fundi    benign, both eyes  Ears:  Normal TM's and external ear canals, both ears  Nose:   Nares normal, septum midline, mucosa normal, no drainage    or sinus tenderness  Throat:   Lips, mucosa, and tongue normal; teeth and gums normal  Neck:   Supple, symmetrical, trachea midline, no adenopathy;    thyroid:  no enlargement/tenderness/nodules; no carotid   bruit or JVD  Back:     Symmetric, no curvature, ROM normal, no CVA tenderness  Lungs:     Clear to auscultation bilaterally, respirations unlabored  Chest Wall:    No tenderness or deformity   Heart:    Regular rate and rhythm, S1 and S2 normal, no murmur, rub   or gallop  Breast Exam:    No tenderness, masses, or nipple abnormality  Abdomen:     Soft, non-tender, bowel sounds active all four quadrants,    no masses, no organomegaly  Genitalia:    Normal female without lesion, discharge or tenderness  Rectal:    Normal tone, no masses or tenderness;   guaiac negative stool  Extremities:   Extremities normal, atraumatic, no cyanosis or edema  Pulses:   2+ and symmetric all extremities  Skin:   Skin color, texture, turgor normal, no rashes or lesions  Lymph nodes:   Cervical, supraclavicular, and axillary nodes normal  Neurologic:   CNII-XII intact, normal strength, sensation and reflexes    throughout    Musculoskeletal:  ROM 0-120, Ligaments intact,  Imaging Review Plain radiographs demonstrate severe degenerative joint disease of the right knee. The overall alignment is  neutral. The bone quality appears to be good for age and reported activity level.  Assessment/Plan: Primary osteoarthritis, right knee   The patient history, physical examination and imaging studies are consistent with advanced degenerative joint disease of the right knee. The patient has failed conservative treatment.  The clearance notes were reviewed.  After discussion with the patient it was felt that Total Knee Replacement was indicated. The procedure,  risks, and benefits of total knee arthroplasty were presented and reviewed. The risks including but not limited to aseptic loosening, infection, blood clots, vascular injury, stiffness, patella tracking problems complications among others were discussed. The patient acknowledged the explanation, agreed to proceed with the plan.  Preoperative templating of the joint replacement has been completed, documented, and submitted to the Operating Room personnel in order to optimize intra-operative equipment management.    Patient's anticipated LOS is less than 2 midnights, meeting these requirements: - Lives within 1 hour of care - Has a competent adult at home to recover with post-op recover - NO history of  - Chronic pain requiring opiods  - Diabetes  - Coronary Artery Disease  - Heart failure  - Heart attack  - Stroke  - DVT/VTE  - Cardiac arrhythmia  - Respiratory Failure/COPD  - Renal failure  - Anemia  - Advanced Liver disease       Donia Ast 10/17/2020, 7:12 AM

## 2020-10-18 ENCOUNTER — Encounter (HOSPITAL_COMMUNITY): Payer: Self-pay | Admitting: Orthopedic Surgery

## 2020-10-18 DIAGNOSIS — Z96652 Presence of left artificial knee joint: Secondary | ICD-10-CM | POA: Diagnosis not present

## 2020-10-18 DIAGNOSIS — M1711 Unilateral primary osteoarthritis, right knee: Secondary | ICD-10-CM | POA: Diagnosis not present

## 2020-10-18 DIAGNOSIS — N184 Chronic kidney disease, stage 4 (severe): Secondary | ICD-10-CM | POA: Diagnosis not present

## 2020-10-18 DIAGNOSIS — E039 Hypothyroidism, unspecified: Secondary | ICD-10-CM | POA: Diagnosis not present

## 2020-10-18 DIAGNOSIS — I129 Hypertensive chronic kidney disease with stage 1 through stage 4 chronic kidney disease, or unspecified chronic kidney disease: Secondary | ICD-10-CM | POA: Diagnosis not present

## 2020-10-18 DIAGNOSIS — Z79899 Other long term (current) drug therapy: Secondary | ICD-10-CM | POA: Diagnosis not present

## 2020-10-18 LAB — BASIC METABOLIC PANEL
Anion gap: 6 (ref 5–15)
BUN: 21 mg/dL (ref 8–23)
CO2: 18 mmol/L — ABNORMAL LOW (ref 22–32)
Calcium: 8.1 mg/dL — ABNORMAL LOW (ref 8.9–10.3)
Chloride: 113 mmol/L — ABNORMAL HIGH (ref 98–111)
Creatinine, Ser: 1.74 mg/dL — ABNORMAL HIGH (ref 0.44–1.00)
GFR, Estimated: 29 mL/min — ABNORMAL LOW (ref >60)
Glucose, Bld: 122 mg/dL — ABNORMAL HIGH (ref 70–99)
Potassium: 3.6 mmol/L (ref 3.5–5.1)
Sodium: 137 mmol/L (ref 135–145)

## 2020-10-18 LAB — CBC
HCT: 35.4 % — ABNORMAL LOW (ref 36.0–46.0)
Hemoglobin: 11.5 g/dL — ABNORMAL LOW (ref 12.0–15.0)
MCH: 32.5 pg (ref 26.0–34.0)
MCHC: 32.5 g/dL (ref 30.0–36.0)
MCV: 100 fL (ref 80.0–100.0)
Platelets: 206 10*3/uL (ref 150–400)
RBC: 3.54 MIL/uL — ABNORMAL LOW (ref 3.87–5.11)
RDW: 14.9 % (ref 11.5–15.5)
WBC: 15.1 10*3/uL — ABNORMAL HIGH (ref 4.0–10.5)
nRBC: 0 % (ref 0.0–0.2)

## 2020-10-18 MED ORDER — APIXABAN 2.5 MG PO TABS: 2.5000 mg | ORAL_TABLET | Freq: Two times a day (BID) | ORAL | 0 refills | Status: AC

## 2020-10-18 MED ORDER — METHOCARBAMOL 500 MG PO TABS: 500.0000 mg | ORAL_TABLET | Freq: Four times a day (QID) | ORAL | 0 refills | Status: AC | PRN

## 2020-10-18 MED ORDER — OXYCODONE HCL 5 MG PO TABS: 5.0000 mg | ORAL_TABLET | Freq: Four times a day (QID) | ORAL | 0 refills | Status: AC | PRN

## 2020-10-18 NOTE — Discharge Summary (Signed)
SPORTS MEDICINE & JOINT REPLACEMENT   Lara Mulch, MD   Carlyon Shadow, PA-C [place], [place], [place]  [address]                             [phone]  PATIENT ID: Jill Fletcher        MRN:  [id number]          DOB/AGE: 1940/07/28 / 80 y.o.    DISCHARGE SUMMARY  ADMISSION DATE:    10/17/2020 DISCHARGE DATE:   10/18/2020   ADMISSION DIAGNOSIS: S/P total knee replacement [Z96.659]    DISCHARGE DIAGNOSIS:  Osteoarthritis of right knee M17.11    ADDITIONAL DIAGNOSIS: Active Problems:   S/P total knee replacement  Past Medical History:  Diagnosis Date  . Anemia   . Chronic kidney disease    Stage IV  . Fibromyalgia   . GERD (gastroesophageal reflux disease)   . Hypercholesteremia   . Hypertension   . Hypothyroidism   . Osteoarthritis of spine   . PONV (postoperative nausea and vomiting)    yrs ago, 08-03-2004 back xray on chart per dr Alvan Dame request  . Primary biliary cirrhosis ([place])     PROCEDURE: Procedure(s): TOTAL KNEE ARTHROPLASTY on 10/17/2020  CONSULTS:    HISTORY:  See H&P in chart  HOSPITAL COURSE:  Jill Fletcher is a 80 y.o. admitted on 10/17/2020 and found to have a diagnosis of Osteoarthritis of right knee M17.11.  After appropriate laboratory studies were obtained  they were taken to the operating room on 10/17/2020 and underwent Procedure(s): TOTAL KNEE ARTHROPLASTY.   They were given perioperative antibiotics:  Anti-infectives (From admission, onward)   Start     Dose/Rate Route Frequency Ordered Stop   10/17/20 1300  ceFAZolin (ANCEF) IVPB 2g/100 mL premix        2 g 200 mL/hr over 30 Minutes Intravenous Every 6 hours 10/17/20 1108 10/17/20 1915   10/17/20 0600  ceFAZolin (ANCEF) IVPB 2g/100 mL premix        2 g 200 mL/hr over 30 Minutes Intravenous On call to O.R. 10/17/20 [id number] 10/17/20 0725    .  Patient given tranexamic acid IV or topical and exparel intra-operatively.  Tolerated the procedure well.    POD# 1: Vital signs were  stable.  Patient denied Chest pain, shortness of breath, or calf pain.  Patient was started on Aspirin twice daily at 8am.  Consults to PT, OT, and care management were made.  The patient was weight bearing as tolerated.  CPM was placed on the operative leg 0-90 degrees for 6-8 hours a day. When out of the CPM, patient was placed in the foam block to achieve full extension. Incentive spirometry was taught.  Dressing was changed.       POD #2, Continued  PT for ambulation and exercise program.  IV saline locked.  O2 discontinued.    The remainder of the hospital course was dedicated to ambulation and strengthening.   The patient was discharged on 1 Day Post-Op in  Good condition.  Blood products given:none  DIAGNOSTIC STUDIES: Recent vital signs:  Patient Vitals for the past 24 hrs:  BP Temp Temp src Pulse Resp SpO2  10/18/20 0946 (!) 117/51 97.8 F (36.6 C) Oral 70 18 100 %  10/18/20 0602 (!) 116/53 97.8 F (36.6 C) Oral 69 16 100 %  10/18/20 0158 (!) 117/57 97.6 F (36.4 C) Oral 70 16 98 %  10/17/20 2205 (!) 124/55 97.7 F (36.5 C) Oral 64 14 100 %  10/17/20 1744 124/66 -- -- 63 20 --  10/17/20 1247 (!) 144/72 (!) 97.4 F (36.3 C) Oral 74 18 --       Recent laboratory studies: Recent Labs    10/18/20 0332  WBC 15.1*  HGB 11.5*  HCT 35.4*  PLT 206   Recent Labs    10/18/20 0332  NA 137  K 3.6  CL 113*  CO2 18*  BUN 21  CREATININE 1.74*  GLUCOSE 122*  CALCIUM 8.1*   Lab Results  Component Value Date   INR 0.87 08/05/2012     Recent Radiographic Studies :  No results found.  DISCHARGE INSTRUCTIONS: Discharge Instructions    Call MD / Call 911   Complete by: As directed    If you experience chest pain or shortness of breath, CALL 911 and be transported to the hospital emergency room.  If you develope a fever above 101 F, pus (white drainage) or increased drainage or redness at the wound, or calf pain, call your surgeon's office.   Constipation Prevention    Complete by: As directed    Drink plenty of fluids.  Prune juice may be helpful.  You may use a stool softener, such as Colace (over the counter) 100 mg twice a day.  Use MiraLax (over the counter) for constipation as needed.   Diet - low sodium heart healthy   Complete by: As directed    Discharge instructions   Complete by: As directed    INSTRUCTIONS AFTER JOINT REPLACEMENT   Remove items at home which could result in a fall. This includes throw rugs or furniture in walking pathways ICE to the affected joint every three hours while awake for 30 minutes at a time, for at least the first 3-5 days, and then as needed for pain and swelling.  Continue to use ice for pain and swelling. You may notice swelling that will progress down to the foot and ankle.  This is normal after surgery.  Elevate your leg when you are not up walking on it.   Continue to use the breathing machine you got in the hospital (incentive spirometer) which will help keep your temperature down.  It is common for your temperature to cycle up and down following surgery, especially at night when you are not up moving around and exerting yourself.  The breathing machine keeps your lungs expanded and your temperature down.   DIET:  As you were doing prior to hospitalization, we recommend a well-balanced diet.  DRESSING / WOUND CARE / SHOWERING  Keep the surgical dressing until follow up.  The dressing is water proof, so you can shower without any extra covering.  IF THE DRESSING FALLS OFF or the wound gets wet inside, change the dressing with sterile gauze.  Please use good hand washing techniques before changing the dressing.  Do not use any lotions or creams on the incision until instructed by your surgeon.    ACTIVITY  Increase activity slowly as tolerated, but follow the weight bearing instructions below.   No driving for 6 weeks or until further direction given by your physician.  You cannot drive while taking narcotics.   No lifting or carrying greater than 10 lbs. until further directed by your surgeon. Avoid periods of inactivity such as sitting longer than an hour when not asleep. This helps prevent blood clots.  You may return to work once you are  authorized by your doctor.     WEIGHT BEARING   Weight bearing as tolerated with assist device (walker, cane, etc) as directed, use it as long as suggested by your surgeon or therapist, typically at least 4-6 weeks.   EXERCISES  Results after joint replacement surgery are often greatly improved when you follow the exercise, range of motion and muscle strengthening exercises prescribed by your doctor. Safety measures are also important to protect the joint from further injury. Any time any of these exercises cause you to have increased pain or swelling, decrease what you are doing until you are comfortable again and then slowly increase them. If you have problems or questions, call your caregiver or physical therapist for advice.   Rehabilitation is important following a joint replacement. After just a few days of immobilization, the muscles of the leg can become weakened and shrink (atrophy).  These exercises are designed to build up the tone and strength of the thigh and leg muscles and to improve motion. Often times heat used for twenty to thirty minutes before working out will loosen up your tissues and help with improving the range of motion but do not use heat for the first two weeks following surgery (sometimes heat can increase post-operative swelling).   These exercises can be done on a training (exercise) mat, on the floor, on a table or on a bed. Use whatever works the best and is most comfortable for you.    Use music or television while you are exercising so that the exercises are a pleasant break in your day. This will make your life better with the exercises acting as a break in your routine that you can look forward to.   Perform all exercises about  fifteen times, three times per day or as directed.  You should exercise both the operative leg and the other leg as well.  Exercises include:   Quad Sets - Tighten up the muscle on the front of the thigh (Quad) and hold for 5-10 seconds.   Straight Leg Raises - With your knee straight (if you were given a brace, keep it on), lift the leg to 60 degrees, hold for 3 seconds, and slowly lower the leg.  Perform this exercise against resistance later as your leg gets stronger.  Leg Slides: Lying on your back, slowly slide your foot toward your buttocks, bending your knee up off the floor (only go as far as is comfortable). Then slowly slide your foot back down until your leg is flat on the floor again.  Angel Wings: Lying on your back spread your legs to the side as far apart as you can without causing discomfort.  Hamstring Strength:  Lying on your back, push your heel against the floor with your leg straight by tightening up the muscles of your buttocks.  Repeat, but this time bend your knee to a comfortable angle, and push your heel against the floor.  You may put a pillow under the heel to make it more comfortable if necessary.   A rehabilitation program following joint replacement surgery can speed recovery and prevent re-injury in the future due to weakened muscles. Contact your doctor or a physical therapist for more information on knee rehabilitation.    CONSTIPATION  Constipation is defined medically as fewer than three stools per week and severe constipation as less than one stool per week.  Even if you have a regular bowel pattern at home, your normal regimen is likely to be disrupted  due to multiple reasons following surgery.  Combination of anesthesia, postoperative narcotics, change in appetite and fluid intake all can affect your bowels.   YOU MUST use at least one of the following options; they are listed in order of increasing strength to get the job done.  They are all available over the  counter, and you may need to use some, POSSIBLY even all of these options:    Drink plenty of fluids (prune juice may be helpful) and high fiber foods Colace 100 mg by mouth twice a day  Senokot for constipation as directed and as needed Dulcolax (bisacodyl), take with full glass of water  Miralax (polyethylene glycol) once or twice a day as needed.  If you have tried all these things and are unable to have a bowel movement in the first 3-4 days after surgery call either your surgeon or your primary doctor.    If you experience loose stools or diarrhea, hold the medications until you stool forms back up.  If your symptoms do not get better within 1 week or if they get worse, check with your doctor.  If you experience "the worst abdominal pain ever" or develop nausea or vomiting, please contact the office immediately for further recommendations for treatment.   ITCHING:  If you experience itching with your medications, try taking only a single pain pill, or even half a pain pill at a time.  You can also use Benadryl over the counter for itching or also to help with sleep.   TED HOSE STOCKINGS:  Use stockings on both legs until for at least 2 weeks or as directed by physician office. They may be removed at night for sleeping.  MEDICATIONS:  See your medication summary on the "After Visit Summary" that nursing will review with you.  You may have some home medications which will be placed on hold until you complete the course of blood thinner medication.  It is important for you to complete the blood thinner medication as prescribed.  PRECAUTIONS:  If you experience chest pain or shortness of breath - call 911 immediately for transfer to the hospital emergency department.   If you develop a fever greater that 101 F, purulent drainage from wound, increased redness or drainage from wound, foul odor from the wound/dressing, or calf pain - CONTACT YOUR SURGEON.                                                    FOLLOW-UP APPOINTMENTS:  If you do not already have a post-op appointment, please call the office for an appointment to be seen by your surgeon.  Guidelines for how soon to be seen are listed in your "After Visit Summary", but are typically between 1-4 weeks after surgery.  OTHER INSTRUCTIONS:   Knee Replacement:  Do not place pillow under knee, focus on keeping the knee straight while resting. CPM instructions: 0-90 degrees, 2 hours in the morning, 2 hours in the afternoon, and 2 hours in the evening. Place foam block, curve side up under heel at all times except when in CPM or when walking.  DO NOT modify, tear, cut, or change the foam block in any way.  POST-OPERATIVE OPIOID TAPER INSTRUCTIONS: It is important to wean off of your opioid medication as soon as possible. If you do not need pain medication  after your surgery it is ok to stop day one. Opioids include: Codeine, Hydrocodone(Norco, Vicodin), Oxycodone(Percocet, oxycontin) and hydromorphone amongst others.  Long term and even short term use of opiods can cause: Increased pain response Dependence Constipation Depression Respiratory depression And more.  Withdrawal symptoms can include Flu like symptoms Nausea, vomiting And more Techniques to manage these symptoms Hydrate well Eat regular healthy meals Stay active Use relaxation techniques(deep breathing, meditating, yoga) Do Not substitute Alcohol to help with tapering If you have been on opioids for less than two weeks and do not have pain than it is ok to stop all together.  Plan to wean off of opioids This plan should start within one week post op of your joint replacement. Maintain the same interval or time between taking each dose and first decrease the dose.  Cut the total daily intake of opioids by one tablet each day Next start to increase the time between doses. The last dose that should be eliminated is the evening dose.     MAKE SURE YOU:  Understand  these instructions.  Get help right away if you are not doing well or get worse.    Thank you for letting us be a part of your medical care team.  It is a privilege we respect greatly.  We hope these instructions will help you stay on track for a fast and full recovery!   Increase activity slowly as tolerated   Complete by: As directed    Post-operative opioid taper instructions:   Complete by: As directed    POST-OPERATIVE OPIOID TAPER INSTRUCTIONS: It is important to wean off of your opioid medication as soon as possible. If you do not need pain medication after your surgery it is ok to stop day one. Opioids include: Codeine, Hydrocodone(Norco, Vicodin), Oxycodone(Percocet, oxycontin) and hydromorphone amongst others.  Long term and even short term use of opiods can cause: Increased pain response Dependence Constipation Depression Respiratory depression And more.  Withdrawal symptoms can include Flu like symptoms Nausea, vomiting And more Techniques to manage these symptoms Hydrate well Eat regular healthy meals Stay active Use relaxation techniques(deep breathing, meditating, yoga) Do Not substitute Alcohol to help with tapering If you have been on opioids for less than two weeks and do not have pain than it is ok to stop all together.  Plan to wean off of opioids This plan should start within one week post op of your joint replacement. Maintain the same interval or time between taking each dose and first decrease the dose.  Cut the total daily intake of opioids by one tablet each day Next start to increase the time between doses. The last dose that should be eliminated is the evening dose.         DISCHARGE MEDICATIONS:   Allergies as of 10/18/2020      Reactions   Tylenol [acetaminophen]    Patient has Primary Biliary Cirrhosis   Penicillins Itching, Rash, Other (See Comments)   Whelps at injections site and itching.  Patient states she tolerates Ceftin.   Rapid  heartbeat   Levaquin [levofloxacin] Itching   Heart pounding   Macrobid [nitrofurantoin] Itching   Heat pounding    Nsaids    Stage 4 kidney disease    Other Itching   ALL MYCINS & CILLINS AND CYCLINS-CANT SLEEP AND HEART POUNDING   Codeine Palpitations   Doxycycline Rash   And like medications. Heart pappitations and itching      Medication List  TAKE these medications   acetaminophen 500 MG tablet Commonly known as: TYLENOL Take 500 mg by mouth daily as needed for moderate pain.   apixaban 2.5 MG Tabs tablet Commonly known as: ELIQUIS Take 1 tablet (2.5 mg total) by mouth 2 (two) times daily.   B-12 500 MCG Subl Place 500 mcg under the tongue daily.   Biotin 5 MG Caps Take 5 mg by mouth daily.   calcium carbonate 500 MG chewable tablet Commonly known as: TUMS - dosed in mg elemental calcium Chew 500 mg by mouth daily as needed for indigestion or heartburn.   Coenzyme Q10 200 MG capsule Take 200 mg by mouth daily.   diphenhydrAMINE-zinc acetate cream Commonly known as: BENADRYL Apply 1 application topically 3 (three) times daily as needed for itching.   DSS 100 MG Caps Take 100 mg by mouth 2 (two) times daily. What changed:   when to take this  reasons to take this   estradiol 0.05 MG/24HR patch Commonly known as: VIVELLE-DOT Place 1 patch onto the skin 2 (two) times a week.   hydrocortisone cream 1 % Apply 1 application topically 2 (two) times daily as needed for itching.   levothyroxine 75 MCG tablet Commonly known as: SYNTHROID Take 75 mcg by mouth daily before breakfast.   Magnesium 250 MG Tabs Take 250 mg by mouth daily.   methocarbamol 500 MG tablet Commonly known as: ROBAXIN Take 1-2 tablets (500-1,000 mg total) by mouth every 6 (six) hours as needed for muscle spasms.   milk thistle 175 MG tablet Take 175 mg by mouth daily.   nystatin powder Commonly known as: MYCOSTATIN/NYSTOP Apply 1 application topically daily.   oxyCODONE 5 MG  immediate release tablet Commonly known as: Oxy IR/ROXICODONE Take 1-2 tablets (5-10 mg total) by mouth every 6 (six) hours as needed for moderate pain (pain score 4-6).   pantoprazole 40 MG tablet Commonly known as: PROTONIX Take 40 mg by mouth every morning.   polyethylene glycol 17 g packet Commonly known as: MIRALAX / GLYCOLAX Take 17 g by mouth 2 (two) times daily. What changed: when to take this   pyridOXINE 100 MG tablet Commonly known as: VITAMIN B-6 Take 100 mg by mouth daily.   REFRESH OP Place 1 drop into both eyes daily as needed (dryness).   sertraline 25 MG tablet Commonly known as: ZOLOFT Take 25 mg by mouth daily.   sodium bicarbonate 650 MG tablet Take 650 mg by mouth daily.   ursodiol 500 MG tablet Commonly known as: ACTIGALL Take 500 mg by mouth 2 (two) times daily.   Vitamin D3 25 MCG (1000 UT) Caps Take 2,000 Units by mouth daily.            Durable Medical Equipment  (From admission, onward)         Start     Ordered   10/17/20 1108  DME Walker rolling  Once       Question:  Patient needs a walker to treat with the following condition  Answer:  S/P total knee replacement   10/17/20 1108   10/17/20 1108  DME 3 n 1  Once        10/17/20 1108   10/17/20 1108  DME Bedside commode  Once       Question:  Patient needs a bedside commode to treat with the following condition  Answer:  S/P total knee replacement   10/17/20 1108          FOLLOW  UP VISIT:    DISPOSITION: HOME VS. SNF  Dental Antibiotics:  In most cases prophylactic antibiotics for Dental procdeures after total joint surgery are not necessary.  Exceptions are as follows:  1. History of prior total joint infection  2. Severely immunocompromised (Organ Transplant, cancer chemotherapy, Rheumatoid biologic meds such as [place])  3. Poorly controlled diabetes (A1C &gt; 8.0, blood glucose over 200)  If you have one of these conditions, contact your surgeon for an  antibiotic prescription, prior to your dental procedure.   CONDITION:  Good   Donia Ast 10/18/2020, 12:22 PM

## 2020-10-18 NOTE — Progress Notes (Signed)
Provided discharge education/instructions, all questions and concerns addressed, Pt not in distress, denies pain. Pt to discharge home with belongings accompanied by family.

## 2020-10-18 NOTE — Progress Notes (Signed)
Physical Therapy Treatment Patient Details Name: Jill Fletcher MRN: [id number] DOB: April 11, 1941 Today's Date: 10/18/2020    History of Present Illness Jill Fletcher is a 80 yo female s/p R TKA. PMH: fibromyalgia, chronic renal disease, HTN, L TKA 2017    PT Comments    Progressing with mobility. Pt reports increased pain on today. Will plan to have a 2nd session prior to possible d/c home later today.   Follow Up Recommendations  Follow surgeon's recommendation for DC plan and follow-up therapies     Equipment Recommendations       Recommendations for Other Services       Precautions / Restrictions Precautions Precautions: Fall Restrictions Weight Bearing Restrictions: No RLE Weight Bearing: Weight bearing as tolerated    Mobility  Bed Mobility Overal bed mobility: Needs Assistance Bed Mobility: Supine to Sit     Supine to sit: Min assist     General bed mobility comments: Assist for R LE. Increased time. Cues required    Transfers Overall transfer level: Needs assistance Equipment used: Rolling walker (2 wheeled) Transfers: Sit to/from Stand Sit to Stand: Min assist         General transfer comment: Assist to power up, steady, control descent. Cues for safety, technique, hand/LE placement. Increased time  Ambulation/Gait Ambulation/Gait assistance: Min assist Gait Distance (Feet): 65 Feet Assistive device: Rolling walker (2 wheeled) Gait Pattern/deviations: Step-to pattern;Decreased weight shift to right;Decreased stance time - right     General Gait Details: Intermittent assist to steady. Cues for safety, sequence. Slow gait speed. Pt denied lightheadedness/dizziness.   Stairs             Wheelchair Mobility    Modified Rankin (Stroke Patients Only)       Balance Overall balance assessment: Needs assistance         Standing balance support: Bilateral upper extremity supported Standing balance-Leahy Scale: Poor                               Cognition Arousal/Alertness: Awake/alert Behavior During Therapy: WFL for tasks assessed/performed Overall Cognitive Status: Within Functional Limits for tasks assessed                                        Exercises Total Joint Exercises Ankle Circles/Pumps: AROM;Both;10 reps Quad Sets: AROM;Both;10 reps Heel Slides: AAROM;Right;10 reps Hip ABduction/ADduction: AAROM;Right;10 reps Straight Leg Raises: AAROM;Right;10 reps Goniometric ROM: ~10-50 degrees    General Comments        Pertinent Vitals/Pain Pain Assessment: 0-10 Pain Score: 7  Pain Location: R knee Pain Descriptors / Indicators: Aching;Sore;Tender Pain Intervention(s): Monitored during session;Repositioned;Ice applied    Home Living                      Prior Function            PT Goals (current goals can now be found in the care plan section) Progress towards PT goals: Progressing toward goals    Frequency    7X/week      PT Plan Current plan remains appropriate    Co-evaluation              AM-PAC PT "6 Clicks" Mobility   Outcome Measure  Help needed turning from your back to your side while in a flat bed without using  bedrails?: A Little Help needed moving from lying on your back to sitting on the side of a flat bed without using bedrails?: A Little Help needed moving to and from a bed to a chair (including a wheelchair)?: A Little Help needed standing up from a chair using your arms (e.g., wheelchair or bedside chair)?: A Little Help needed to walk in hospital room?: A Little Help needed climbing 3-5 steps with a railing? : A Lot 6 Click Score: 17    End of Session Equipment Utilized During Treatment: Gait belt Activity Tolerance: Patient limited by pain Patient left: in chair;with call bell/phone within reach   PT Visit Diagnosis: Other abnormalities of gait and mobility (R26.89);Difficulty in walking, not elsewhere classified  (R26.2) Pain - Right/Left: Right Pain - part of body: Knee     Time: [id number] PT Time Calculation (min) (ACUTE ONLY): 28 min  Charges:  $Gait Training: 8-22 mins $Therapeutic Exercise: 8-22 mins                         Doreatha Massed, PT Acute Rehabilitation  Office: [phone] Pager: [phone]

## 2020-10-18 NOTE — Plan of Care (Signed)

## 2020-10-18 NOTE — TOC Transition Note (Signed)
Transition of Care [facility]) - CM/SW Discharge Note  Patient Details  Name: Jill Fletcher MRN: [id number] Date of Birth: [birth year]  Transition of Care [facility]) CM/SW Contact:  Sherie Don, LCSW Phone Number: 10/18/2020, 10:36 AM  Clinical Narrative: Patient is expected to discharge after working with PT. CSW met with patient to review discharge plan. Per patient, she has a rolling walker, a toilet riser in addition to high toilets, and a CPM at home so there are no DME needs at this time. Patient is scheduled for OPPT through [facility]. TOC signing off.  Final next level of care: OP Rehab Barriers to Discharge: No Barriers Identified  Patient Goals and CMS Choice Patient states their goals for this hospitalization and ongoing recovery are:: Go to OPPT at [facility] CMS Medicare.gov Compare Post Acute Care list provided to:: Patient Choice offered to / list presented to : NA  Discharge Plan and Services         DME Arranged: N/A DME Agency: NA  Readmission Risk Interventions No flowsheet data found.

## 2020-10-18 NOTE — Progress Notes (Signed)
SPORTS MEDICINE AND JOINT REPLACEMENT  Lara Mulch, MD    Carlyon Shadow, PA-C [facility], [place], [place]  [address]                             [phone]   PROGRESS NOTE  Subjective:  negative for Chest Pain  negative for Shortness of Breath  negative for Nausea/Vomiting   negative for Calf Pain  negative for Bowel Movement   Tolerating Diet: yes         Patient reports pain as 4 on 0-10 scale.    Objective: Vital signs in last 24 hours:   Patient Vitals for the past 24 hrs:  BP Temp Temp src Pulse Resp SpO2  10/18/20 0946 (!) 117/51 97.8 F (36.6 C) Oral 70 18 100 %  10/18/20 0602 (!) 116/53 97.8 F (36.6 C) Oral 69 16 100 %  10/18/20 0158 (!) 117/57 97.6 F (36.4 C) Oral 70 16 98 %  10/17/20 2205 (!) 124/55 97.7 F (36.5 C) Oral 64 14 100 %  10/17/20 1744 124/66 -- -- 63 20 --  10/17/20 1247 (!) 144/72 (!) 97.4 F (36.3 C) Oral 74 18 --    '@flow'$ {1959:LAST@   Intake/Output from previous day:   06/06 0701 - 06/07 0700 In: 3715.6 [P.O.:1050; I.V.:2365.6] Out: 1900 [Urine:1850]   Intake/Output this shift:   06/07 0701 - 06/07 1900 In: 427.5 [P.O.:240; I.V.:187.5] Out: -    Intake/Output      06/06 0701 06/07 0700 06/07 0701 06/08 0700   P.O. 1050 240   I.V. (mL/kg) 2365.6 (36.6) 187.5 (2.9)   IV Piggyback 300    Total Intake(mL/kg) 3715.6 (57.4) 427.5 (6.6)   Urine (mL/kg/hr) 1850 (1.2)    Blood 50    Total Output 1900    Net +1815.6 +427.5        Urine Occurrence  1 x   Stool Occurrence  0 x      LABORATORY DATA: Recent Labs    10/18/20 0332  WBC 15.1*  HGB 11.5*  HCT 35.4*  PLT 206   Recent Labs    10/18/20 0332  NA 137  K 3.6  CL 113*  CO2 18*  BUN 21  CREATININE 1.74*  GLUCOSE 122*  CALCIUM 8.1*   Lab Results  Component Value Date   INR 0.87 08/05/2012    Examination:  General appearance: alert, cooperative and no distress Extremities: extremities normal, atraumatic, no cyanosis or edema  Wound Exam:  clean, dry, intact   Drainage:  None: wound tissue dry  Motor Exam: Quadriceps and Hamstrings Intact  Sensory Exam: Superficial Peroneal, Deep Peroneal and Tibial normal   Assessment:    1 Day Post-Op  Procedure(s) (LRB): TOTAL KNEE ARTHROPLASTY (Right)  ADDITIONAL DIAGNOSIS:  Active Problems:   S/P total knee replacement     Plan: Physical Therapy as ordered Weight Bearing as Tolerated (WBAT)  DVT Prophylaxis:  eliquis  DISCHARGE PLAN: Home  Patient doing well and ready for D/C home       Patient's anticipated LOS is less than 2 midnights, meeting these requirements: - Lives within 1 hour of care - Has a competent adult at home to recover with post-op recover - NO history of  - Chronic pain requiring opiods  - Diabetes  - Coronary Artery Disease  - Heart failure  - Heart attack  - Stroke  - DVT/VTE  - Cardiac arrhythmia  - Respiratory Failure/COPD  -  Renal failure  - Anemia  - Advanced Liver disease        Donia Ast 10/18/2020, 12:17 PM

## 2020-10-18 NOTE — Progress Notes (Signed)
Physical Therapy Treatment Patient Details Name: Jill Fletcher MRN: [id number] DOB: [birth year] Today's Date: 10/18/2020    History of Present Illness Jill Fletcher is a 80 yo female s/p R TKA. PMH: fibromyalgia, chronic renal disease, HTN, L TKA 2017    PT Comments    Pt continues to participate fairly well. She continues to report moderate pain with activity. All education completed. Plan is to d/c home later today.    Follow Up Recommendations  Follow surgeon's recommendation for DC plan and follow-up therapies; Supervision for OOB/mobility     Equipment Recommendations       Recommendations for Other Services       Precautions / Restrictions Precautions Precautions: Fall Restrictions Weight Bearing Restrictions: No RLE Weight Bearing: Weight bearing as tolerated    Mobility  Bed Mobility Overal bed mobility: Needs Assistance Bed Mobility: Supine to Sit;Sit to Supine     Supine to sit: HOB elevated Sit to supine: Min assist;HOB elevated   General bed mobility comments: Assist for R LE. Increased time. Cues required    Transfers Overall transfer level: Needs assistance Equipment used: Rolling walker (2 wheeled) Transfers: Sit to/from Stand Sit to Stand: Min assist         General transfer comment: Assist to power up, steady, control descent. Cues for safety, technique, hand/LE placement. Increased time  Ambulation/Gait Ambulation/Gait assistance: Min assist Gait Distance (Feet): 65 Feet Assistive device: Rolling walker (2 wheeled) Gait Pattern/deviations: Step-to pattern;Decreased weight shift to right;Decreased stance time - right     General Gait Details: Intermittent assist to steady. Cues for safety, sequence. Slow gait speed. Pt denied lightheadedness/dizziness.   Stairs             Wheelchair Mobility    Modified Rankin (Stroke Patients Only)       Balance Overall balance assessment: Needs assistance         Standing balance  support: Bilateral upper extremity supported Standing balance-Leahy Scale: Poor                              Cognition Arousal/Alertness: Awake/alert Behavior During Therapy: WFL for tasks assessed/performed Overall Cognitive Status: Within Functional Limits for tasks assessed                                        Exercises     General Comments        Pertinent Vitals/Pain Pain Assessment: 0-10 Pain Score: 7  Pain Location: R knee Pain Descriptors / Indicators: Aching;Sore;Tender Pain Intervention(s): Limited activity within patient's tolerance;Monitored during session;Repositioned    Home Living                      Prior Function            PT Goals (current goals can now be found in the care plan section) Progress towards PT goals: Progressing toward goals    Frequency    7X/week      PT Plan Current plan remains appropriate    Co-evaluation              AM-PAC PT "6 Clicks" Mobility   Outcome Measure  Help needed turning from your back to your side while in a flat bed without using bedrails?: A Little Help needed moving from lying on your back to sitting  on the side of a flat bed without using bedrails?: A Little Help needed moving to and from a bed to a chair (including a wheelchair)?: A Little Help needed standing up from a chair using your arms (e.g., wheelchair or bedside chair)?: A Little Help needed to walk in hospital room?: A Little Help needed climbing 3-5 steps with a railing? : A Lot 6 Click Score: 17    End of Session Equipment Utilized During Treatment: Gait belt Activity Tolerance: Patient tolerated treatment well;Patient limited by pain Patient left: in bed;with call bell/phone within reach   PT Visit Diagnosis: Other abnormalities of gait and mobility (R26.89);Difficulty in walking, not elsewhere classified (R26.2) Pain - Right/Left: Right Pain - part of body: Knee     Time:  1353-1414 PT Time Calculation (min) (ACUTE ONLY): 21 min  Charges:  $Gait Training: 8-22 mins $Therapeutic Exercise: 8-22 mins                         Doreatha Massed, PT Acute Rehabilitation  Office: [phone] Pager: [phone]

## 2020-10-24 DIAGNOSIS — M25561 Pain in right knee: Secondary | ICD-10-CM | POA: Diagnosis not present

## 2020-10-24 DIAGNOSIS — M6281 Muscle weakness (generalized): Secondary | ICD-10-CM | POA: Diagnosis not present

## 2020-10-26 DIAGNOSIS — M6281 Muscle weakness (generalized): Secondary | ICD-10-CM | POA: Diagnosis not present

## 2020-10-26 DIAGNOSIS — M25561 Pain in right knee: Secondary | ICD-10-CM | POA: Diagnosis not present

## 2020-10-27 DIAGNOSIS — G8918 Other acute postprocedural pain: Secondary | ICD-10-CM | POA: Diagnosis not present

## 2020-10-27 DIAGNOSIS — M25561 Pain in right knee: Secondary | ICD-10-CM | POA: Diagnosis not present

## 2020-10-27 DIAGNOSIS — M25461 Effusion, right knee: Secondary | ICD-10-CM | POA: Diagnosis not present

## 2020-10-31 DIAGNOSIS — M25561 Pain in right knee: Secondary | ICD-10-CM | POA: Diagnosis not present

## 2020-10-31 DIAGNOSIS — M6281 Muscle weakness (generalized): Secondary | ICD-10-CM | POA: Diagnosis not present

## 2020-11-01 DIAGNOSIS — F322 Major depressive disorder, single episode, severe without psychotic features: Secondary | ICD-10-CM | POA: Diagnosis not present

## 2020-11-01 DIAGNOSIS — F419 Anxiety disorder, unspecified: Secondary | ICD-10-CM | POA: Diagnosis not present

## 2020-11-03 DIAGNOSIS — M6281 Muscle weakness (generalized): Secondary | ICD-10-CM | POA: Diagnosis not present

## 2020-11-03 DIAGNOSIS — M25561 Pain in right knee: Secondary | ICD-10-CM | POA: Diagnosis not present

## 2020-11-08 DIAGNOSIS — M25561 Pain in right knee: Secondary | ICD-10-CM | POA: Diagnosis not present

## 2020-11-08 DIAGNOSIS — M6281 Muscle weakness (generalized): Secondary | ICD-10-CM | POA: Diagnosis not present

## 2020-11-10 DIAGNOSIS — M25561 Pain in right knee: Secondary | ICD-10-CM | POA: Diagnosis not present

## 2020-11-10 DIAGNOSIS — M6281 Muscle weakness (generalized): Secondary | ICD-10-CM | POA: Diagnosis not present

## 2020-11-15 DIAGNOSIS — M25561 Pain in right knee: Secondary | ICD-10-CM | POA: Diagnosis not present

## 2020-11-15 DIAGNOSIS — M6281 Muscle weakness (generalized): Secondary | ICD-10-CM | POA: Diagnosis not present

## 2020-11-17 DIAGNOSIS — M25561 Pain in right knee: Secondary | ICD-10-CM | POA: Diagnosis not present

## 2020-11-17 DIAGNOSIS — M6281 Muscle weakness (generalized): Secondary | ICD-10-CM | POA: Diagnosis not present

## 2020-11-21 DIAGNOSIS — M6281 Muscle weakness (generalized): Secondary | ICD-10-CM | POA: Diagnosis not present

## 2020-11-21 DIAGNOSIS — M25561 Pain in right knee: Secondary | ICD-10-CM | POA: Diagnosis not present

## 2020-11-24 DIAGNOSIS — F322 Major depressive disorder, single episode, severe without psychotic features: Secondary | ICD-10-CM | POA: Diagnosis not present

## 2020-11-25 DIAGNOSIS — M25561 Pain in right knee: Secondary | ICD-10-CM | POA: Diagnosis not present

## 2020-11-25 DIAGNOSIS — M6281 Muscle weakness (generalized): Secondary | ICD-10-CM | POA: Diagnosis not present

## 2020-11-29 DIAGNOSIS — M25561 Pain in right knee: Secondary | ICD-10-CM | POA: Diagnosis not present

## 2020-11-29 DIAGNOSIS — M6281 Muscle weakness (generalized): Secondary | ICD-10-CM | POA: Diagnosis not present

## 2020-12-01 DIAGNOSIS — M6281 Muscle weakness (generalized): Secondary | ICD-10-CM | POA: Diagnosis not present

## 2020-12-01 DIAGNOSIS — M25561 Pain in right knee: Secondary | ICD-10-CM | POA: Diagnosis not present

## 2020-12-05 DIAGNOSIS — M6281 Muscle weakness (generalized): Secondary | ICD-10-CM | POA: Diagnosis not present

## 2020-12-05 DIAGNOSIS — M25561 Pain in right knee: Secondary | ICD-10-CM | POA: Diagnosis not present

## 2020-12-07 DIAGNOSIS — M25561 Pain in right knee: Secondary | ICD-10-CM | POA: Diagnosis not present

## 2020-12-07 DIAGNOSIS — M6281 Muscle weakness (generalized): Secondary | ICD-10-CM | POA: Diagnosis not present

## 2020-12-14 DIAGNOSIS — M6281 Muscle weakness (generalized): Secondary | ICD-10-CM | POA: Diagnosis not present

## 2020-12-14 DIAGNOSIS — M25561 Pain in right knee: Secondary | ICD-10-CM | POA: Diagnosis not present

## 2020-12-20 DIAGNOSIS — M25561 Pain in right knee: Secondary | ICD-10-CM | POA: Diagnosis not present

## 2020-12-20 DIAGNOSIS — M6281 Muscle weakness (generalized): Secondary | ICD-10-CM | POA: Diagnosis not present

## 2021-01-26 DIAGNOSIS — Z96651 Presence of right artificial knee joint: Secondary | ICD-10-CM | POA: Diagnosis not present

## 2021-02-06 DIAGNOSIS — N2581 Secondary hyperparathyroidism of renal origin: Secondary | ICD-10-CM | POA: Diagnosis not present

## 2021-02-06 DIAGNOSIS — E785 Hyperlipidemia, unspecified: Secondary | ICD-10-CM | POA: Diagnosis not present

## 2021-02-06 DIAGNOSIS — K743 Primary biliary cirrhosis: Secondary | ICD-10-CM | POA: Diagnosis not present

## 2021-02-06 DIAGNOSIS — I129 Hypertensive chronic kidney disease with stage 1 through stage 4 chronic kidney disease, or unspecified chronic kidney disease: Secondary | ICD-10-CM | POA: Diagnosis not present

## 2021-02-06 DIAGNOSIS — N184 Chronic kidney disease, stage 4 (severe): Secondary | ICD-10-CM | POA: Diagnosis not present

## 2021-02-06 DIAGNOSIS — N390 Urinary tract infection, site not specified: Secondary | ICD-10-CM | POA: Diagnosis not present

## 2021-02-23 DIAGNOSIS — Z1331 Encounter for screening for depression: Secondary | ICD-10-CM | POA: Diagnosis not present

## 2021-02-23 DIAGNOSIS — Z1231 Encounter for screening mammogram for malignant neoplasm of breast: Secondary | ICD-10-CM | POA: Diagnosis not present

## 2021-02-23 DIAGNOSIS — Z0000 Encounter for general adult medical examination without abnormal findings: Secondary | ICD-10-CM | POA: Diagnosis not present

## 2021-03-03 DIAGNOSIS — K743 Primary biliary cirrhosis: Secondary | ICD-10-CM | POA: Diagnosis not present

## 2021-03-03 DIAGNOSIS — Z79899 Other long term (current) drug therapy: Secondary | ICD-10-CM | POA: Diagnosis not present

## 2021-03-03 DIAGNOSIS — I10 Essential (primary) hypertension: Secondary | ICD-10-CM | POA: Diagnosis not present

## 2021-03-03 DIAGNOSIS — E039 Hypothyroidism, unspecified: Secondary | ICD-10-CM | POA: Diagnosis not present

## 2021-03-03 DIAGNOSIS — E559 Vitamin D deficiency, unspecified: Secondary | ICD-10-CM | POA: Diagnosis not present

## 2021-03-13 DIAGNOSIS — H04123 Dry eye syndrome of bilateral lacrimal glands: Secondary | ICD-10-CM | POA: Diagnosis not present

## 2021-03-13 DIAGNOSIS — H353131 Nonexudative age-related macular degeneration, bilateral, early dry stage: Secondary | ICD-10-CM | POA: Diagnosis not present

## 2021-03-13 DIAGNOSIS — H02055 Trichiasis without entropian left lower eyelid: Secondary | ICD-10-CM | POA: Diagnosis not present

## 2021-03-14 DIAGNOSIS — Z1231 Encounter for screening mammogram for malignant neoplasm of breast: Secondary | ICD-10-CM | POA: Diagnosis not present

## 2021-05-23 DIAGNOSIS — M25471 Effusion, right ankle: Secondary | ICD-10-CM | POA: Diagnosis not present

## 2021-05-23 DIAGNOSIS — M25571 Pain in right ankle and joints of right foot: Secondary | ICD-10-CM | POA: Diagnosis not present

## 2021-07-04 DIAGNOSIS — R300 Dysuria: Secondary | ICD-10-CM | POA: Diagnosis not present

## 2021-07-04 DIAGNOSIS — M549 Dorsalgia, unspecified: Secondary | ICD-10-CM | POA: Diagnosis not present

## 2021-07-04 DIAGNOSIS — R739 Hyperglycemia, unspecified: Secondary | ICD-10-CM | POA: Diagnosis not present

## 2021-07-04 DIAGNOSIS — Z6822 Body mass index (BMI) 22.0-22.9, adult: Secondary | ICD-10-CM | POA: Diagnosis not present

## 2021-07-04 DIAGNOSIS — N390 Urinary tract infection, site not specified: Secondary | ICD-10-CM | POA: Diagnosis not present

## 2021-07-04 DIAGNOSIS — R319 Hematuria, unspecified: Secondary | ICD-10-CM | POA: Diagnosis not present

## 2021-07-04 DIAGNOSIS — N184 Chronic kidney disease, stage 4 (severe): Secondary | ICD-10-CM | POA: Diagnosis not present

## 2021-07-04 DIAGNOSIS — R81 Glycosuria: Secondary | ICD-10-CM | POA: Diagnosis not present

## 2021-07-04 DIAGNOSIS — N2889 Other specified disorders of kidney and ureter: Secondary | ICD-10-CM | POA: Diagnosis not present

## 2021-07-12 DIAGNOSIS — R7989 Other specified abnormal findings of blood chemistry: Secondary | ICD-10-CM | POA: Diagnosis not present

## 2021-07-27 DIAGNOSIS — N390 Urinary tract infection, site not specified: Secondary | ICD-10-CM | POA: Diagnosis not present

## 2021-07-27 DIAGNOSIS — K743 Primary biliary cirrhosis: Secondary | ICD-10-CM | POA: Diagnosis not present

## 2021-07-27 DIAGNOSIS — E785 Hyperlipidemia, unspecified: Secondary | ICD-10-CM | POA: Diagnosis not present

## 2021-07-27 DIAGNOSIS — I129 Hypertensive chronic kidney disease with stage 1 through stage 4 chronic kidney disease, or unspecified chronic kidney disease: Secondary | ICD-10-CM | POA: Diagnosis not present

## 2021-07-27 DIAGNOSIS — N184 Chronic kidney disease, stage 4 (severe): Secondary | ICD-10-CM | POA: Diagnosis not present

## 2021-07-27 DIAGNOSIS — N2581 Secondary hyperparathyroidism of renal origin: Secondary | ICD-10-CM | POA: Diagnosis not present

## 2021-10-27 DIAGNOSIS — W19XXXA Unspecified fall, initial encounter: Secondary | ICD-10-CM | POA: Diagnosis not present

## 2021-10-27 DIAGNOSIS — M25551 Pain in right hip: Secondary | ICD-10-CM | POA: Diagnosis not present

## 2021-10-27 DIAGNOSIS — S72011A Unspecified intracapsular fracture of right femur, initial encounter for closed fracture: Secondary | ICD-10-CM | POA: Diagnosis not present

## 2021-10-27 DIAGNOSIS — E872 Acidosis, unspecified: Secondary | ICD-10-CM | POA: Diagnosis not present

## 2021-10-27 DIAGNOSIS — Z043 Encounter for examination and observation following other accident: Secondary | ICD-10-CM | POA: Diagnosis not present

## 2021-10-27 DIAGNOSIS — I700 Atherosclerosis of aorta: Secondary | ICD-10-CM | POA: Diagnosis not present

## 2021-10-27 DIAGNOSIS — S72091A Other fracture of head and neck of right femur, initial encounter for closed fracture: Secondary | ICD-10-CM | POA: Diagnosis not present

## 2021-10-27 DIAGNOSIS — S72001A Fracture of unspecified part of neck of right femur, initial encounter for closed fracture: Secondary | ICD-10-CM | POA: Diagnosis not present

## 2021-10-27 DIAGNOSIS — R9431 Abnormal electrocardiogram [ECG] [EKG]: Secondary | ICD-10-CM | POA: Diagnosis not present

## 2021-10-27 DIAGNOSIS — N179 Acute kidney failure, unspecified: Secondary | ICD-10-CM | POA: Diagnosis not present

## 2021-10-28 DIAGNOSIS — N179 Acute kidney failure, unspecified: Secondary | ICD-10-CM | POA: Diagnosis not present

## 2021-10-28 DIAGNOSIS — Z96641 Presence of right artificial hip joint: Secondary | ICD-10-CM | POA: Diagnosis not present

## 2021-10-28 DIAGNOSIS — Z471 Aftercare following joint replacement surgery: Secondary | ICD-10-CM | POA: Diagnosis not present

## 2021-10-28 DIAGNOSIS — E872 Acidosis, unspecified: Secondary | ICD-10-CM | POA: Diagnosis not present

## 2021-10-28 DIAGNOSIS — M84651A Pathological fracture in other disease, right femur, initial encounter for fracture: Secondary | ICD-10-CM | POA: Diagnosis not present

## 2021-10-28 DIAGNOSIS — M1611 Unilateral primary osteoarthritis, right hip: Secondary | ICD-10-CM | POA: Diagnosis not present

## 2021-10-28 DIAGNOSIS — S72011A Unspecified intracapsular fracture of right femur, initial encounter for closed fracture: Secondary | ICD-10-CM | POA: Diagnosis not present

## 2021-10-28 DIAGNOSIS — S72001A Fracture of unspecified part of neck of right femur, initial encounter for closed fracture: Secondary | ICD-10-CM | POA: Diagnosis not present

## 2021-10-29 DIAGNOSIS — E872 Acidosis, unspecified: Secondary | ICD-10-CM | POA: Diagnosis not present

## 2021-10-29 DIAGNOSIS — S72011A Unspecified intracapsular fracture of right femur, initial encounter for closed fracture: Secondary | ICD-10-CM | POA: Diagnosis not present

## 2021-10-29 DIAGNOSIS — N179 Acute kidney failure, unspecified: Secondary | ICD-10-CM | POA: Diagnosis not present

## 2021-10-30 DIAGNOSIS — N179 Acute kidney failure, unspecified: Secondary | ICD-10-CM | POA: Diagnosis not present

## 2021-10-30 DIAGNOSIS — E872 Acidosis, unspecified: Secondary | ICD-10-CM | POA: Diagnosis not present

## 2021-10-30 DIAGNOSIS — S72011A Unspecified intracapsular fracture of right femur, initial encounter for closed fracture: Secondary | ICD-10-CM | POA: Diagnosis not present

## 2021-10-31 DIAGNOSIS — E872 Acidosis, unspecified: Secondary | ICD-10-CM | POA: Diagnosis not present

## 2021-10-31 DIAGNOSIS — R1310 Dysphagia, unspecified: Secondary | ICD-10-CM | POA: Diagnosis not present

## 2021-10-31 DIAGNOSIS — E039 Hypothyroidism, unspecified: Secondary | ICD-10-CM | POA: Diagnosis not present

## 2021-10-31 DIAGNOSIS — W19XXXD Unspecified fall, subsequent encounter: Secondary | ICD-10-CM | POA: Diagnosis not present

## 2021-10-31 DIAGNOSIS — F32A Depression, unspecified: Secondary | ICD-10-CM | POA: Diagnosis not present

## 2021-10-31 DIAGNOSIS — R2689 Other abnormalities of gait and mobility: Secondary | ICD-10-CM | POA: Diagnosis not present

## 2021-10-31 DIAGNOSIS — S72001D Fracture of unspecified part of neck of right femur, subsequent encounter for closed fracture with routine healing: Secondary | ICD-10-CM | POA: Diagnosis not present

## 2021-10-31 DIAGNOSIS — I959 Hypotension, unspecified: Secondary | ICD-10-CM | POA: Diagnosis not present

## 2021-10-31 DIAGNOSIS — Z743 Need for continuous supervision: Secondary | ICD-10-CM | POA: Diagnosis not present

## 2021-10-31 DIAGNOSIS — R279 Unspecified lack of coordination: Secondary | ICD-10-CM | POA: Diagnosis not present

## 2021-10-31 DIAGNOSIS — R262 Difficulty in walking, not elsewhere classified: Secondary | ICD-10-CM | POA: Diagnosis not present

## 2021-10-31 DIAGNOSIS — N1831 Chronic kidney disease, stage 3a: Secondary | ICD-10-CM | POA: Diagnosis not present

## 2021-10-31 DIAGNOSIS — Z471 Aftercare following joint replacement surgery: Secondary | ICD-10-CM | POA: Diagnosis not present

## 2021-10-31 DIAGNOSIS — S72011A Unspecified intracapsular fracture of right femur, initial encounter for closed fracture: Secondary | ICD-10-CM | POA: Diagnosis not present

## 2021-10-31 DIAGNOSIS — K743 Primary biliary cirrhosis: Secondary | ICD-10-CM | POA: Diagnosis not present

## 2021-10-31 DIAGNOSIS — K219 Gastro-esophageal reflux disease without esophagitis: Secondary | ICD-10-CM | POA: Diagnosis not present

## 2021-10-31 DIAGNOSIS — N179 Acute kidney failure, unspecified: Secondary | ICD-10-CM | POA: Diagnosis not present

## 2021-10-31 DIAGNOSIS — R531 Weakness: Secondary | ICD-10-CM | POA: Diagnosis not present

## 2021-10-31 DIAGNOSIS — M6281 Muscle weakness (generalized): Secondary | ICD-10-CM | POA: Diagnosis not present

## 2021-10-31 DIAGNOSIS — D62 Acute posthemorrhagic anemia: Secondary | ICD-10-CM | POA: Diagnosis not present

## 2021-10-31 DIAGNOSIS — D631 Anemia in chronic kidney disease: Secondary | ICD-10-CM | POA: Diagnosis not present

## 2021-10-31 DIAGNOSIS — N189 Chronic kidney disease, unspecified: Secondary | ICD-10-CM | POA: Diagnosis not present

## 2021-10-31 DIAGNOSIS — M810 Age-related osteoporosis without current pathological fracture: Secondary | ICD-10-CM | POA: Diagnosis not present

## 2021-10-31 DIAGNOSIS — E876 Hypokalemia: Secondary | ICD-10-CM | POA: Diagnosis not present

## 2021-11-03 DIAGNOSIS — R262 Difficulty in walking, not elsewhere classified: Secondary | ICD-10-CM | POA: Diagnosis not present

## 2021-11-03 DIAGNOSIS — N1831 Chronic kidney disease, stage 3a: Secondary | ICD-10-CM | POA: Diagnosis not present

## 2021-11-03 DIAGNOSIS — S72001D Fracture of unspecified part of neck of right femur, subsequent encounter for closed fracture with routine healing: Secondary | ICD-10-CM | POA: Diagnosis not present

## 2021-11-03 DIAGNOSIS — D62 Acute posthemorrhagic anemia: Secondary | ICD-10-CM | POA: Diagnosis not present

## 2021-11-09 DIAGNOSIS — D631 Anemia in chronic kidney disease: Secondary | ICD-10-CM | POA: Diagnosis not present

## 2021-11-09 DIAGNOSIS — K5909 Other constipation: Secondary | ICD-10-CM | POA: Diagnosis not present

## 2021-11-09 DIAGNOSIS — M80051D Age-related osteoporosis with current pathological fracture, right femur, subsequent encounter for fracture with routine healing: Secondary | ICD-10-CM | POA: Diagnosis not present

## 2021-11-09 DIAGNOSIS — F32A Depression, unspecified: Secondary | ICD-10-CM | POA: Diagnosis not present

## 2021-11-09 DIAGNOSIS — Z79891 Long term (current) use of opiate analgesic: Secondary | ICD-10-CM | POA: Diagnosis not present

## 2021-11-09 DIAGNOSIS — N183 Chronic kidney disease, stage 3 unspecified: Secondary | ICD-10-CM | POA: Diagnosis not present

## 2021-11-09 DIAGNOSIS — Z96653 Presence of artificial knee joint, bilateral: Secondary | ICD-10-CM | POA: Diagnosis not present

## 2021-11-09 DIAGNOSIS — E039 Hypothyroidism, unspecified: Secondary | ICD-10-CM | POA: Diagnosis not present

## 2021-11-09 DIAGNOSIS — K745 Biliary cirrhosis, unspecified: Secondary | ICD-10-CM | POA: Diagnosis not present

## 2021-11-09 DIAGNOSIS — N179 Acute kidney failure, unspecified: Secondary | ICD-10-CM | POA: Diagnosis not present

## 2021-11-09 DIAGNOSIS — Z96641 Presence of right artificial hip joint: Secondary | ICD-10-CM | POA: Diagnosis not present

## 2021-11-09 DIAGNOSIS — K219 Gastro-esophageal reflux disease without esophagitis: Secondary | ICD-10-CM | POA: Diagnosis not present

## 2021-11-09 DIAGNOSIS — Z9181 History of falling: Secondary | ICD-10-CM | POA: Diagnosis not present

## 2021-11-10 DIAGNOSIS — E039 Hypothyroidism, unspecified: Secondary | ICD-10-CM | POA: Diagnosis not present

## 2021-11-10 DIAGNOSIS — K219 Gastro-esophageal reflux disease without esophagitis: Secondary | ICD-10-CM | POA: Diagnosis not present

## 2021-11-10 DIAGNOSIS — N183 Chronic kidney disease, stage 3 unspecified: Secondary | ICD-10-CM | POA: Diagnosis not present

## 2021-11-10 DIAGNOSIS — D631 Anemia in chronic kidney disease: Secondary | ICD-10-CM | POA: Diagnosis not present

## 2021-11-10 DIAGNOSIS — M80051D Age-related osteoporosis with current pathological fracture, right femur, subsequent encounter for fracture with routine healing: Secondary | ICD-10-CM | POA: Diagnosis not present

## 2021-11-10 DIAGNOSIS — F32A Depression, unspecified: Secondary | ICD-10-CM | POA: Diagnosis not present

## 2021-11-13 DIAGNOSIS — E039 Hypothyroidism, unspecified: Secondary | ICD-10-CM | POA: Diagnosis not present

## 2021-11-13 DIAGNOSIS — M80051D Age-related osteoporosis with current pathological fracture, right femur, subsequent encounter for fracture with routine healing: Secondary | ICD-10-CM | POA: Diagnosis not present

## 2021-11-13 DIAGNOSIS — K219 Gastro-esophageal reflux disease without esophagitis: Secondary | ICD-10-CM | POA: Diagnosis not present

## 2021-11-13 DIAGNOSIS — N183 Chronic kidney disease, stage 3 unspecified: Secondary | ICD-10-CM | POA: Diagnosis not present

## 2021-11-13 DIAGNOSIS — D631 Anemia in chronic kidney disease: Secondary | ICD-10-CM | POA: Diagnosis not present

## 2021-11-13 DIAGNOSIS — F32A Depression, unspecified: Secondary | ICD-10-CM | POA: Diagnosis not present

## 2021-11-15 ENCOUNTER — Telehealth: Payer: Self-pay | Admitting: Gastroenterology

## 2021-11-15 DIAGNOSIS — M80051D Age-related osteoporosis with current pathological fracture, right femur, subsequent encounter for fracture with routine healing: Secondary | ICD-10-CM | POA: Diagnosis not present

## 2021-11-15 DIAGNOSIS — N183 Chronic kidney disease, stage 3 unspecified: Secondary | ICD-10-CM | POA: Diagnosis not present

## 2021-11-15 DIAGNOSIS — K219 Gastro-esophageal reflux disease without esophagitis: Secondary | ICD-10-CM | POA: Diagnosis not present

## 2021-11-15 DIAGNOSIS — F32A Depression, unspecified: Secondary | ICD-10-CM | POA: Diagnosis not present

## 2021-11-15 DIAGNOSIS — D631 Anemia in chronic kidney disease: Secondary | ICD-10-CM | POA: Diagnosis not present

## 2021-11-15 DIAGNOSIS — E039 Hypothyroidism, unspecified: Secondary | ICD-10-CM | POA: Diagnosis not present

## 2021-11-15 NOTE — Telephone Encounter (Signed)
Patient called to schedule an office visit with Dr. Lyndel Safe for [place]. She has GI history with Dr. Lyda Jester requested records for review.

## 2021-11-16 DIAGNOSIS — F32A Depression, unspecified: Secondary | ICD-10-CM | POA: Diagnosis not present

## 2021-11-16 DIAGNOSIS — E039 Hypothyroidism, unspecified: Secondary | ICD-10-CM | POA: Diagnosis not present

## 2021-11-16 DIAGNOSIS — N183 Chronic kidney disease, stage 3 unspecified: Secondary | ICD-10-CM | POA: Diagnosis not present

## 2021-11-16 DIAGNOSIS — M80051D Age-related osteoporosis with current pathological fracture, right femur, subsequent encounter for fracture with routine healing: Secondary | ICD-10-CM | POA: Diagnosis not present

## 2021-11-16 DIAGNOSIS — K219 Gastro-esophageal reflux disease without esophagitis: Secondary | ICD-10-CM | POA: Diagnosis not present

## 2021-11-16 DIAGNOSIS — D631 Anemia in chronic kidney disease: Secondary | ICD-10-CM | POA: Diagnosis not present

## 2021-11-21 DIAGNOSIS — K219 Gastro-esophageal reflux disease without esophagitis: Secondary | ICD-10-CM | POA: Diagnosis not present

## 2021-11-21 DIAGNOSIS — F32A Depression, unspecified: Secondary | ICD-10-CM | POA: Diagnosis not present

## 2021-11-21 DIAGNOSIS — E039 Hypothyroidism, unspecified: Secondary | ICD-10-CM | POA: Diagnosis not present

## 2021-11-21 DIAGNOSIS — N183 Chronic kidney disease, stage 3 unspecified: Secondary | ICD-10-CM | POA: Diagnosis not present

## 2021-11-21 DIAGNOSIS — M80051D Age-related osteoporosis with current pathological fracture, right femur, subsequent encounter for fracture with routine healing: Secondary | ICD-10-CM | POA: Diagnosis not present

## 2021-11-21 DIAGNOSIS — D631 Anemia in chronic kidney disease: Secondary | ICD-10-CM | POA: Diagnosis not present

## 2021-11-21 NOTE — Telephone Encounter (Signed)
Good Morning Dr. Lyndel Safe,   We have received records from [place] for patient requesting to be seen by you for her issues with GERD. Patient was last seen in 2017 with Dr. Lyda Jester. I  will be sending records for you to review, will you please review and advise on scheduling?  Thank you.

## 2021-11-22 DIAGNOSIS — N183 Chronic kidney disease, stage 3 unspecified: Secondary | ICD-10-CM | POA: Diagnosis not present

## 2021-11-22 DIAGNOSIS — M80051D Age-related osteoporosis with current pathological fracture, right femur, subsequent encounter for fracture with routine healing: Secondary | ICD-10-CM | POA: Diagnosis not present

## 2021-11-22 DIAGNOSIS — E039 Hypothyroidism, unspecified: Secondary | ICD-10-CM | POA: Diagnosis not present

## 2021-11-22 DIAGNOSIS — K219 Gastro-esophageal reflux disease without esophagitis: Secondary | ICD-10-CM | POA: Diagnosis not present

## 2021-11-22 DIAGNOSIS — D631 Anemia in chronic kidney disease: Secondary | ICD-10-CM | POA: Diagnosis not present

## 2021-11-22 DIAGNOSIS — F32A Depression, unspecified: Secondary | ICD-10-CM | POA: Diagnosis not present

## 2021-11-23 DIAGNOSIS — Z96651 Presence of right artificial knee joint: Secondary | ICD-10-CM | POA: Diagnosis not present

## 2021-11-23 NOTE — Telephone Encounter (Signed)
Records reviewed and patient was told to continue care with Dr Lyda Jester and she understands

## 2021-11-27 DIAGNOSIS — N183 Chronic kidney disease, stage 3 unspecified: Secondary | ICD-10-CM | POA: Diagnosis not present

## 2021-11-27 DIAGNOSIS — E039 Hypothyroidism, unspecified: Secondary | ICD-10-CM | POA: Diagnosis not present

## 2021-11-27 DIAGNOSIS — D631 Anemia in chronic kidney disease: Secondary | ICD-10-CM | POA: Diagnosis not present

## 2021-11-27 DIAGNOSIS — F32A Depression, unspecified: Secondary | ICD-10-CM | POA: Diagnosis not present

## 2021-11-27 DIAGNOSIS — M80051D Age-related osteoporosis with current pathological fracture, right femur, subsequent encounter for fracture with routine healing: Secondary | ICD-10-CM | POA: Diagnosis not present

## 2021-11-27 DIAGNOSIS — K219 Gastro-esophageal reflux disease without esophagitis: Secondary | ICD-10-CM | POA: Diagnosis not present

## 2021-11-30 DIAGNOSIS — K219 Gastro-esophageal reflux disease without esophagitis: Secondary | ICD-10-CM | POA: Diagnosis not present

## 2021-11-30 DIAGNOSIS — D631 Anemia in chronic kidney disease: Secondary | ICD-10-CM | POA: Diagnosis not present

## 2021-11-30 DIAGNOSIS — E039 Hypothyroidism, unspecified: Secondary | ICD-10-CM | POA: Diagnosis not present

## 2021-11-30 DIAGNOSIS — N183 Chronic kidney disease, stage 3 unspecified: Secondary | ICD-10-CM | POA: Diagnosis not present

## 2021-11-30 DIAGNOSIS — M80051D Age-related osteoporosis with current pathological fracture, right femur, subsequent encounter for fracture with routine healing: Secondary | ICD-10-CM | POA: Diagnosis not present

## 2021-11-30 DIAGNOSIS — F32A Depression, unspecified: Secondary | ICD-10-CM | POA: Diagnosis not present

## 2021-12-04 DIAGNOSIS — M80051D Age-related osteoporosis with current pathological fracture, right femur, subsequent encounter for fracture with routine healing: Secondary | ICD-10-CM | POA: Diagnosis not present

## 2021-12-04 DIAGNOSIS — K219 Gastro-esophageal reflux disease without esophagitis: Secondary | ICD-10-CM | POA: Diagnosis not present

## 2021-12-04 DIAGNOSIS — D631 Anemia in chronic kidney disease: Secondary | ICD-10-CM | POA: Diagnosis not present

## 2021-12-04 DIAGNOSIS — N183 Chronic kidney disease, stage 3 unspecified: Secondary | ICD-10-CM | POA: Diagnosis not present

## 2021-12-04 DIAGNOSIS — E039 Hypothyroidism, unspecified: Secondary | ICD-10-CM | POA: Diagnosis not present

## 2021-12-04 DIAGNOSIS — F32A Depression, unspecified: Secondary | ICD-10-CM | POA: Diagnosis not present

## 2021-12-11 DIAGNOSIS — S72001A Fracture of unspecified part of neck of right femur, initial encounter for closed fracture: Secondary | ICD-10-CM | POA: Diagnosis not present

## 2022-01-08 DIAGNOSIS — N184 Chronic kidney disease, stage 4 (severe): Secondary | ICD-10-CM | POA: Diagnosis not present

## 2022-01-08 DIAGNOSIS — Z79890 Hormone replacement therapy (postmenopausal): Secondary | ICD-10-CM | POA: Diagnosis not present

## 2022-01-08 DIAGNOSIS — M810 Age-related osteoporosis without current pathological fracture: Secondary | ICD-10-CM | POA: Diagnosis not present

## 2022-01-08 DIAGNOSIS — K219 Gastro-esophageal reflux disease without esophagitis: Secondary | ICD-10-CM | POA: Diagnosis not present

## 2022-01-08 DIAGNOSIS — Z6821 Body mass index (BMI) 21.0-21.9, adult: Secondary | ICD-10-CM | POA: Diagnosis not present

## 2022-01-08 DIAGNOSIS — F329 Major depressive disorder, single episode, unspecified: Secondary | ICD-10-CM | POA: Diagnosis not present

## 2022-01-08 DIAGNOSIS — K5901 Slow transit constipation: Secondary | ICD-10-CM | POA: Diagnosis not present

## 2022-01-08 DIAGNOSIS — E038 Other specified hypothyroidism: Secondary | ICD-10-CM | POA: Diagnosis not present

## 2022-01-25 DIAGNOSIS — N3001 Acute cystitis with hematuria: Secondary | ICD-10-CM | POA: Diagnosis not present

## 2022-01-25 DIAGNOSIS — N3000 Acute cystitis without hematuria: Secondary | ICD-10-CM | POA: Diagnosis not present

## 2022-01-29 DIAGNOSIS — S72001A Fracture of unspecified part of neck of right femur, initial encounter for closed fracture: Secondary | ICD-10-CM | POA: Diagnosis not present

## 2022-01-31 DIAGNOSIS — N2581 Secondary hyperparathyroidism of renal origin: Secondary | ICD-10-CM | POA: Diagnosis not present

## 2022-01-31 DIAGNOSIS — I129 Hypertensive chronic kidney disease with stage 1 through stage 4 chronic kidney disease, or unspecified chronic kidney disease: Secondary | ICD-10-CM | POA: Diagnosis not present

## 2022-01-31 DIAGNOSIS — N184 Chronic kidney disease, stage 4 (severe): Secondary | ICD-10-CM | POA: Diagnosis not present

## 2022-02-09 DIAGNOSIS — N184 Chronic kidney disease, stage 4 (severe): Secondary | ICD-10-CM | POA: Diagnosis not present

## 2022-03-05 DIAGNOSIS — N23 Unspecified renal colic: Secondary | ICD-10-CM | POA: Diagnosis not present

## 2022-03-05 DIAGNOSIS — R300 Dysuria: Secondary | ICD-10-CM | POA: Diagnosis not present

## 2022-03-05 DIAGNOSIS — R1030 Lower abdominal pain, unspecified: Secondary | ICD-10-CM | POA: Diagnosis not present

## 2022-03-14 DIAGNOSIS — Z1389 Encounter for screening for other disorder: Secondary | ICD-10-CM | POA: Diagnosis not present

## 2022-03-14 DIAGNOSIS — N184 Chronic kidney disease, stage 4 (severe): Secondary | ICD-10-CM | POA: Diagnosis not present

## 2022-03-14 DIAGNOSIS — E038 Other specified hypothyroidism: Secondary | ICD-10-CM | POA: Diagnosis not present

## 2022-03-14 DIAGNOSIS — F3340 Major depressive disorder, recurrent, in remission, unspecified: Secondary | ICD-10-CM | POA: Diagnosis not present

## 2022-03-14 DIAGNOSIS — Z79890 Hormone replacement therapy (postmenopausal): Secondary | ICD-10-CM | POA: Diagnosis not present

## 2022-03-14 DIAGNOSIS — Z0000 Encounter for general adult medical examination without abnormal findings: Secondary | ICD-10-CM | POA: Diagnosis not present

## 2022-03-14 DIAGNOSIS — Z6821 Body mass index (BMI) 21.0-21.9, adult: Secondary | ICD-10-CM | POA: Diagnosis not present

## 2022-03-14 DIAGNOSIS — K219 Gastro-esophageal reflux disease without esophagitis: Secondary | ICD-10-CM | POA: Diagnosis not present

## 2022-03-23 DIAGNOSIS — K3189 Other diseases of stomach and duodenum: Secondary | ICD-10-CM | POA: Diagnosis not present

## 2022-03-23 DIAGNOSIS — M4317 Spondylolisthesis, lumbosacral region: Secondary | ICD-10-CM | POA: Diagnosis not present

## 2022-03-23 DIAGNOSIS — K6389 Other specified diseases of intestine: Secondary | ICD-10-CM | POA: Diagnosis not present

## 2022-03-23 DIAGNOSIS — I700 Atherosclerosis of aorta: Secondary | ICD-10-CM | POA: Diagnosis not present

## 2022-03-23 DIAGNOSIS — R1030 Lower abdominal pain, unspecified: Secondary | ICD-10-CM | POA: Diagnosis not present

## 2022-03-28 DIAGNOSIS — H353131 Nonexudative age-related macular degeneration, bilateral, early dry stage: Secondary | ICD-10-CM | POA: Diagnosis not present

## 2022-04-03 DIAGNOSIS — Z1231 Encounter for screening mammogram for malignant neoplasm of breast: Secondary | ICD-10-CM | POA: Diagnosis not present

## 2022-04-06 DIAGNOSIS — R300 Dysuria: Secondary | ICD-10-CM | POA: Diagnosis not present

## 2022-04-23 DIAGNOSIS — N184 Chronic kidney disease, stage 4 (severe): Secondary | ICD-10-CM | POA: Diagnosis not present

## 2022-04-30 DIAGNOSIS — S72001A Fracture of unspecified part of neck of right femur, initial encounter for closed fracture: Secondary | ICD-10-CM | POA: Diagnosis not present

## 2022-05-01 DIAGNOSIS — R1084 Generalized abdominal pain: Secondary | ICD-10-CM | POA: Diagnosis not present

## 2022-05-01 DIAGNOSIS — I129 Hypertensive chronic kidney disease with stage 1 through stage 4 chronic kidney disease, or unspecified chronic kidney disease: Secondary | ICD-10-CM | POA: Diagnosis not present

## 2022-05-01 DIAGNOSIS — N184 Chronic kidney disease, stage 4 (severe): Secondary | ICD-10-CM | POA: Diagnosis not present

## 2022-05-01 DIAGNOSIS — N2581 Secondary hyperparathyroidism of renal origin: Secondary | ICD-10-CM | POA: Diagnosis not present

## 2022-05-01 DIAGNOSIS — N185 Chronic kidney disease, stage 5: Secondary | ICD-10-CM | POA: Diagnosis not present

## 2022-05-01 DIAGNOSIS — R809 Proteinuria, unspecified: Secondary | ICD-10-CM | POA: Diagnosis not present

## 2022-05-31 DIAGNOSIS — G9009 Other idiopathic peripheral autonomic neuropathy: Secondary | ICD-10-CM | POA: Diagnosis not present

## 2022-06-12 ENCOUNTER — Other Ambulatory Visit (HOSPITAL_COMMUNITY): Payer: Self-pay | Admitting: Nephrology

## 2022-06-12 DIAGNOSIS — R809 Proteinuria, unspecified: Secondary | ICD-10-CM

## 2022-06-21 ENCOUNTER — Other Ambulatory Visit: Payer: Self-pay | Admitting: Radiology

## 2022-06-21 DIAGNOSIS — R809 Proteinuria, unspecified: Secondary | ICD-10-CM

## 2022-06-25 ENCOUNTER — Other Ambulatory Visit: Payer: Self-pay | Admitting: Internal Medicine

## 2022-06-26 ENCOUNTER — Other Ambulatory Visit: Payer: Self-pay | Admitting: Radiology

## 2022-06-26 ENCOUNTER — Ambulatory Visit (HOSPITAL_COMMUNITY)
Admission: RE | Admit: 2022-06-26 | Discharge: 2022-06-26 | Disposition: A | Discharge status: Home / Self Care | Payer: Medicare Other | Source: Ambulatory Visit | Attending: Nephrology | Admitting: Nephrology

## 2022-06-26 ENCOUNTER — Encounter (HOSPITAL_COMMUNITY): Payer: Self-pay

## 2022-06-26 DIAGNOSIS — N184 Chronic kidney disease, stage 4 (severe): Secondary | ICD-10-CM | POA: Diagnosis not present

## 2022-06-26 DIAGNOSIS — I129 Hypertensive chronic kidney disease with stage 1 through stage 4 chronic kidney disease, or unspecified chronic kidney disease: Secondary | ICD-10-CM | POA: Diagnosis not present

## 2022-06-26 DIAGNOSIS — R809 Proteinuria, unspecified: Secondary | ICD-10-CM | POA: Insufficient documentation

## 2022-06-26 LAB — CBC
HCT: 39.7 % (ref 36.0–46.0)
Hemoglobin: 12.6 g/dL (ref 12.0–15.0)
MCH: 31.2 pg (ref 26.0–34.0)
MCHC: 31.7 g/dL (ref 30.0–36.0)
MCV: 98.3 fL (ref 80.0–100.0)
Platelets: 264 10*3/uL (ref 150–400)
RBC: 4.04 MIL/uL (ref 3.87–5.11)
RDW: 14.8 % (ref 11.5–15.5)
WBC: 7.2 10*3/uL (ref 4.0–10.5)
nRBC: 0 % (ref 0.0–0.2)

## 2022-06-26 LAB — PROTIME-INR
INR: 0.9 (ref 0.8–1.2)
Prothrombin Time: 12.3 seconds (ref 11.4–15.2)

## 2022-06-26 MED ORDER — FENTANYL CITRATE (PF) 100 MCG/2ML IJ SOLN
INTRAMUSCULAR | Status: AC | PRN
  Administered 2022-06-26 (×2): 25 ug via INTRAVENOUS

## 2022-06-26 MED ORDER — SODIUM CHLORIDE 0.9 % IV SOLN: INTRAVENOUS | Status: DC

## 2022-06-26 MED ORDER — GELATIN ABSORBABLE 12-7 MM EX MISC
1.0000 | Freq: Once | CUTANEOUS | Status: AC
  Administered 2022-06-26: 1 via TOPICAL

## 2022-06-26 MED ORDER — MIDAZOLAM HCL 2 MG/2ML IJ SOLN
INTRAMUSCULAR | Status: AC | PRN
  Administered 2022-06-26: 1 mg via INTRAVENOUS
  Administered 2022-06-26: .5 mg via INTRAVENOUS

## 2022-06-26 MED ORDER — LIDOCAINE HCL (PF) 1 % IJ SOLN
8.0000 mL | Freq: Once | INTRAMUSCULAR | Status: AC
  Administered 2022-06-26: 8 mL via INTRADERMAL

## 2022-06-26 MED ORDER — MIDAZOLAM HCL 2 MG/2ML IJ SOLN
INTRAMUSCULAR | Status: AC
  Filled 2022-06-26: qty 2

## 2022-06-26 MED ORDER — FENTANYL CITRATE (PF) 100 MCG/2ML IJ SOLN
INTRAMUSCULAR | Status: AC
  Filled 2022-06-26: qty 2

## 2022-06-26 NOTE — Procedures (Signed)
Interventional Radiology Procedure Note  Procedure: US guided left renal biopsy  Indication: Proteinuria  Findings: Please refer to procedural dictation for full description.  Complications: None  EBL: < 10 mL  Jill Roux, MD [phone]

## 2022-06-26 NOTE — H&P (Signed)
Chief Complaint: Patient was seen in consultation today for proteinuria- random renal biopsy at the request of Singh,Vikas  Referring Physician(s): Singh,Vikas  Supervising Physician: Mir, Biochemist, clinical  Patient Status: [facility] - Out-pt  History of Present Illness: Jill Fletcher is a 82 y.o. female   Known CKD stage 4 Follows with Dr Gean Quint Proteinuria - unknown etiology Nephrology requesting random renal biopsy  Scheduled now for same   Past Medical History:  Diagnosis Date   Anemia    Chronic kidney disease    Stage IV   Fibromyalgia    GERD (gastroesophageal reflux disease)    Hypercholesteremia    Hypertension    Hypothyroidism    Osteoarthritis of spine    PONV (postoperative nausea and vomiting)    yrs ago, 08-03-2004 back xray on chart per dr Alvan Dame request   Primary biliary cirrhosis [facility])     Past Surgical History:  Procedure Laterality Date   ABDOMINAL HYSTERECTOMY     APPENDECTOMY     bladder tach  yrs ago   BUNIONECTOMY Bilateral yrs ago   CHOLECYSTECTOMY     COLONOSCOPY  03/09/2014   Dr. Lyda Jester Mild diverticular disease left colon. External hemorrhoids small   ESOPHAGOGASTRODUODENOSCOPY  03/09/2014   Dr Lyda Jester. Small hiatal hernia. No evidence of esophageal varices   hysterectomy----unknown     lumbar surgery ----- unknown     MASTOIDECTOMY     right shoulder surgery  yrs ago   TONSILLECTOMY     TOTAL KNEE ARTHROPLASTY Left 08/12/2012   Procedure: TOTAL KNEE ARTHROPLASTY;  Surgeon: Mauri Pole, MD;  Location: [facility] ORS;  Service: Orthopedics;  Laterality: Left;   TOTAL KNEE ARTHROPLASTY Right 10/17/2020   Procedure: TOTAL KNEE ARTHROPLASTY;  Surgeon: Vickey Huger, MD;  Location: [facility];  Service: Orthopedics;  Laterality: Right;    Allergies: Tylenol [acetaminophen], Penicillins, Levaquin [levofloxacin], Macrobid [nitrofurantoin], Nsaids, Other, Codeine, and Doxycycline  Medications: Prior to Admission medications   Medication  Sig Start Date End Date Taking? Authorizing Provider  acetaminophen (TYLENOL) 500 MG tablet Take 500 mg by mouth daily as needed for moderate pain.   Yes [provider]  Biotin 5 MG CAPS Take 5 mg by mouth daily.   Yes [provider]  Cholecalciferol (VITAMIN D3) 1000 UNITS CAPS Take 2,000 Units by mouth daily.   Yes [provider]  Coenzyme Q10 200 MG capsule Take 200 mg by mouth daily.   Yes [provider]  Cyanocobalamin (B-12) 500 MCG SUBL Place 500 mcg under the tongue daily.   Yes [provider]  estradiol (VIVELLE-DOT) 0.05 MG/24HR Place 1 patch onto the skin 2 (two) times a week.   Yes [provider]  levothyroxine (SYNTHROID, LEVOTHROID) 75 MCG tablet Take 75 mcg by mouth daily before breakfast.    Yes [provider]  Magnesium 250 MG TABS Take 250 mg by mouth daily.   Yes [provider]  methocarbamol (ROBAXIN) 500 MG tablet Take 1-2 tablets (500-1,000 mg total) by mouth every 6 (six) hours as needed for muscle spasms. 10/18/20  Yes Donia Ast, PA  milk thistle 175 MG tablet Take 175 mg by mouth daily.   Yes [provider]  pantoprazole (PROTONIX) 40 MG tablet Take 40 mg by mouth every morning.    Yes [provider]  polyethylene glycol (MIRALAX / GLYCOLAX) packet Take 17 g by mouth 2 (two) times daily. Patient taking differently: Take 17 g by mouth daily. 08/13/12  Yes Babish,  Rodman Key, PA-C  Polyvinyl Alcohol-Povidone (REFRESH OP) Place 1 drop into both eyes daily as needed (dryness).   Yes [provider]  pyridOXINE (VITAMIN B-6) 100 MG tablet Take 100 mg by mouth daily.   Yes [provider]  sertraline (ZOLOFT) 25 MG tablet Take 25 mg by mouth daily.   Yes [provider]  sodium bicarbonate 650 MG tablet Take 650 mg by mouth daily.   Yes [provider]  ursodiol (ACTIGALL) 500 MG tablet Take 500 mg by mouth 2 (two) times daily.   Yes [provider]  apixaban (ELIQUIS) 2.5 MG TABS tablet Take 1 tablet (2.5 mg total) by mouth 2 (two) times daily. 10/18/20   Donia Ast, PA  calcium carbonate (TUMS - DOSED IN MG ELEMENTAL CALCIUM) 500 MG chewable tablet Chew 500 mg by mouth daily as needed for indigestion or heartburn.    [provider]  diphenhydrAMINE-zinc acetate (BENADRYL) cream Apply 1 application topically 3 (three) times daily as needed for itching.    [provider]  docusate sodium 100 MG CAPS Take 100 mg by mouth 2 (two) times daily. Patient taking differently: Take 100 mg by mouth 2 (two) times daily as needed (constipation). 08/13/12   Danae Orleans, PA-C  hydrocortisone cream 1 % Apply 1 application topically 2 (two) times daily as needed for itching.    [provider]  nystatin (MYCOSTATIN/NYSTOP) powder Apply 1 application topically daily. 09/09/20   [provider]  oxyCODONE (OXY IR/ROXICODONE) 5 MG immediate release tablet Take 1-2 tablets (5-10 mg total) by mouth every 6 (six) hours as needed for moderate pain (pain score 4-6). 10/18/20   Donia Ast, PA     Family History  Problem Relation Age of Onset   Diabetes Father    Hypertension Mother    Arthritis Mother    Diabetes Brother     Social History   Socioeconomic History   Marital status: Widowed    Spouse name: Not on file   Number of children: 2   Years of education: Not on file   Highest education level: Not on file  Occupational History   Not on file  Tobacco Use   Smoking status: Never   Smokeless tobacco: Never  Vaping Use   Vaping Use: Never used  Substance and Sexual Activity   Alcohol use: No   Drug use: No   Sexual activity: Not on file  Other Topics Concern   Not on file  Social History Narrative   Not on file   Social Determinants of Health   Financial Resource Strain: Not on file  Food Insecurity: Not on file  Transportation Needs: Not on file  Physical Activity: Not  on file  Stress: Not on file  Social Connections: Not on file    Review of Systems: A 12 point ROS discussed and pertinent positives are indicated in the HPI above.  All other systems are negative.  Review of Systems  Constitutional:  Negative for activity change, fatigue and fever.  Respiratory:  Negative for cough and shortness of breath.   Cardiovascular:  Negative for chest pain.  Gastrointestinal:  Negative for abdominal pain, nausea and vomiting.  Neurological:  Negative for weakness.  Psychiatric/Behavioral:  Negative for behavioral problems and confusion.     Vital Signs: BP 139/69   Pulse 69   Temp 98.2 F (36.8 C) (Oral)   Resp 16   Ht 5' 7"$  (1.702 m)   Wt 128 lb (  58.1 kg)   SpO2 99%   BMI 20.05 kg/m    Physical Exam Vitals reviewed.  HENT:     Mouth/Throat:     Mouth: Mucous membranes are moist.  Cardiovascular:     Rate and Rhythm: Normal rate and regular rhythm.     Heart sounds: Normal heart sounds.  Pulmonary:     Effort: Pulmonary effort is normal.     Breath sounds: Normal breath sounds.  Abdominal:     Tenderness: There is no abdominal tenderness.  Musculoskeletal:        General: Normal range of motion.     Right lower leg: No edema.     Left lower leg: No edema.  Skin:    General: Skin is warm.  Neurological:     Mental Status: She is alert and oriented to person, place, and time.  Psychiatric:        Behavior: Behavior normal.     Imaging: No results found.  Labs:  CBC: No results for input(s): "WBC", "HGB", "HCT", "PLT" in the last 8760 hours.  COAGS: No results for input(s): "INR", "APTT" in the last 8760 hours.  BMP: No results for input(s): "NA", "K", "CL", "CO2", "GLUCOSE", "BUN", "CALCIUM", "CREATININE", "GFRNONAA", "GFRAA" in the last 8760 hours.  Invalid input(s): "CMP"  LIVER FUNCTION TESTS: No results for input(s): "BILITOT", "AST", "ALT", "ALKPHOS", "PROT", "ALBUMIN" in the last 8760 hours.  TUMOR MARKERS: No  results for input(s): "AFPTM", "CEA", "CA199", "CHROMGRNA" in the last 8760 hours.  Assessment and Plan:  Chronic kidney disease stage 4 - follows with Nephrology for "years" Proteinuria - unknown etiology Scheduled for random renal biopsy Risks and benefits of random renal biopsy was discussed with the patient and/or patient's family including, but not limited to bleeding, infection, damage to adjacent structures or low yield requiring additional tests.  All of the questions were answered and there is agreement to proceed. Consent signed and in chart.  Thank you for this interesting consult.  I greatly enjoyed meeting Jill Fletcher and look forward to participating in their care.  A copy of this report was sent to the requesting provider on this date.  Electronically Signed: Lavonia Drafts, PA-C 06/26/2022, 6:44 AM   I spent a total of  30 Minutes   in face to face in clinical consultation, greater than 50% of which was counseling/coordinating care for random renal biopsy

## 2022-07-02 ENCOUNTER — Encounter (HOSPITAL_COMMUNITY): Payer: Self-pay

## 2022-07-02 DIAGNOSIS — I129 Hypertensive chronic kidney disease with stage 1 through stage 4 chronic kidney disease, or unspecified chronic kidney disease: Secondary | ICD-10-CM | POA: Diagnosis not present

## 2022-07-02 DIAGNOSIS — N184 Chronic kidney disease, stage 4 (severe): Secondary | ICD-10-CM | POA: Diagnosis not present

## 2022-07-02 DIAGNOSIS — N2581 Secondary hyperparathyroidism of renal origin: Secondary | ICD-10-CM | POA: Diagnosis not present

## 2022-07-02 DIAGNOSIS — R809 Proteinuria, unspecified: Secondary | ICD-10-CM | POA: Diagnosis not present

## 2022-07-02 LAB — SURGICAL PATHOLOGY

## 2022-08-27 DIAGNOSIS — N184 Chronic kidney disease, stage 4 (severe): Secondary | ICD-10-CM | POA: Diagnosis not present

## 2022-09-03 DIAGNOSIS — E872 Acidosis, unspecified: Secondary | ICD-10-CM | POA: Diagnosis not present

## 2022-09-03 DIAGNOSIS — I129 Hypertensive chronic kidney disease with stage 1 through stage 4 chronic kidney disease, or unspecified chronic kidney disease: Secondary | ICD-10-CM | POA: Diagnosis not present

## 2022-09-03 DIAGNOSIS — R609 Edema, unspecified: Secondary | ICD-10-CM | POA: Diagnosis not present

## 2022-09-03 DIAGNOSIS — N2581 Secondary hyperparathyroidism of renal origin: Secondary | ICD-10-CM | POA: Diagnosis not present

## 2022-09-03 DIAGNOSIS — N184 Chronic kidney disease, stage 4 (severe): Secondary | ICD-10-CM | POA: Diagnosis not present

## 2022-09-10 DIAGNOSIS — M19079 Primary osteoarthritis, unspecified ankle and foot: Secondary | ICD-10-CM | POA: Diagnosis not present

## 2022-09-10 DIAGNOSIS — M25472 Effusion, left ankle: Secondary | ICD-10-CM | POA: Diagnosis not present

## 2022-09-10 DIAGNOSIS — M25572 Pain in left ankle and joints of left foot: Secondary | ICD-10-CM | POA: Diagnosis not present

## 2022-10-29 DIAGNOSIS — F329 Major depressive disorder, single episode, unspecified: Secondary | ICD-10-CM | POA: Diagnosis not present

## 2022-10-29 DIAGNOSIS — N184 Chronic kidney disease, stage 4 (severe): Secondary | ICD-10-CM | POA: Diagnosis not present

## 2022-10-29 DIAGNOSIS — E039 Hypothyroidism, unspecified: Secondary | ICD-10-CM | POA: Diagnosis not present

## 2022-10-29 DIAGNOSIS — K219 Gastro-esophageal reflux disease without esophagitis: Secondary | ICD-10-CM | POA: Diagnosis not present

## 2022-12-19 DIAGNOSIS — N184 Chronic kidney disease, stage 4 (severe): Secondary | ICD-10-CM | POA: Diagnosis not present

## 2022-12-19 DIAGNOSIS — N2581 Secondary hyperparathyroidism of renal origin: Secondary | ICD-10-CM | POA: Diagnosis not present

## 2022-12-19 DIAGNOSIS — R609 Edema, unspecified: Secondary | ICD-10-CM | POA: Diagnosis not present

## 2022-12-19 DIAGNOSIS — I129 Hypertensive chronic kidney disease with stage 1 through stage 4 chronic kidney disease, or unspecified chronic kidney disease: Secondary | ICD-10-CM | POA: Diagnosis not present

## 2023-01-30 DIAGNOSIS — L570 Actinic keratosis: Secondary | ICD-10-CM | POA: Diagnosis not present

## 2023-01-30 DIAGNOSIS — L821 Other seborrheic keratosis: Secondary | ICD-10-CM | POA: Diagnosis not present

## 2023-04-05 DIAGNOSIS — H353131 Nonexudative age-related macular degeneration, bilateral, early dry stage: Secondary | ICD-10-CM | POA: Diagnosis not present

## 2023-04-05 DIAGNOSIS — H524 Presbyopia: Secondary | ICD-10-CM | POA: Diagnosis not present

## 2023-04-08 DIAGNOSIS — Z1389 Encounter for screening for other disorder: Secondary | ICD-10-CM | POA: Diagnosis not present

## 2023-04-08 DIAGNOSIS — N184 Chronic kidney disease, stage 4 (severe): Secondary | ICD-10-CM | POA: Diagnosis not present

## 2023-04-08 DIAGNOSIS — I700 Atherosclerosis of aorta: Secondary | ICD-10-CM | POA: Diagnosis not present

## 2023-04-08 DIAGNOSIS — K219 Gastro-esophageal reflux disease without esophagitis: Secondary | ICD-10-CM | POA: Diagnosis not present

## 2023-04-08 DIAGNOSIS — Z0001 Encounter for general adult medical examination with abnormal findings: Secondary | ICD-10-CM | POA: Diagnosis not present

## 2023-04-08 DIAGNOSIS — F3340 Major depressive disorder, recurrent, in remission, unspecified: Secondary | ICD-10-CM | POA: Diagnosis not present

## 2023-04-08 DIAGNOSIS — Z0000 Encounter for general adult medical examination without abnormal findings: Secondary | ICD-10-CM | POA: Diagnosis not present

## 2023-04-08 DIAGNOSIS — E039 Hypothyroidism, unspecified: Secondary | ICD-10-CM | POA: Diagnosis not present

## 2023-05-01 DIAGNOSIS — R112 Nausea with vomiting, unspecified: Secondary | ICD-10-CM | POA: Diagnosis not present

## 2023-05-01 DIAGNOSIS — N184 Chronic kidney disease, stage 4 (severe): Secondary | ICD-10-CM | POA: Diagnosis not present

## 2023-05-01 DIAGNOSIS — R197 Diarrhea, unspecified: Secondary | ICD-10-CM | POA: Diagnosis not present

## 2023-05-01 DIAGNOSIS — N2581 Secondary hyperparathyroidism of renal origin: Secondary | ICD-10-CM | POA: Diagnosis not present

## 2023-05-01 DIAGNOSIS — E872 Acidosis, unspecified: Secondary | ICD-10-CM | POA: Diagnosis not present

## 2023-05-01 DIAGNOSIS — I129 Hypertensive chronic kidney disease with stage 1 through stage 4 chronic kidney disease, or unspecified chronic kidney disease: Secondary | ICD-10-CM | POA: Diagnosis not present

## 2023-07-08 DIAGNOSIS — E039 Hypothyroidism, unspecified: Secondary | ICD-10-CM | POA: Diagnosis not present

## 2023-07-08 DIAGNOSIS — F329 Major depressive disorder, single episode, unspecified: Secondary | ICD-10-CM | POA: Diagnosis not present

## 2023-07-08 DIAGNOSIS — K219 Gastro-esophageal reflux disease without esophagitis: Secondary | ICD-10-CM | POA: Diagnosis not present

## 2023-07-08 DIAGNOSIS — Z79890 Hormone replacement therapy (postmenopausal): Secondary | ICD-10-CM | POA: Diagnosis not present

## 2023-07-08 DIAGNOSIS — I700 Atherosclerosis of aorta: Secondary | ICD-10-CM | POA: Diagnosis not present

## 2023-07-08 DIAGNOSIS — Z1231 Encounter for screening mammogram for malignant neoplasm of breast: Secondary | ICD-10-CM | POA: Diagnosis not present

## 2023-07-08 DIAGNOSIS — N184 Chronic kidney disease, stage 4 (severe): Secondary | ICD-10-CM | POA: Diagnosis not present

## 2023-07-08 DIAGNOSIS — Z681 Body mass index (BMI) 19 or less, adult: Secondary | ICD-10-CM | POA: Diagnosis not present

## 2023-07-08 DIAGNOSIS — E7800 Pure hypercholesterolemia, unspecified: Secondary | ICD-10-CM | POA: Diagnosis not present

## 2023-07-22 DIAGNOSIS — N184 Chronic kidney disease, stage 4 (severe): Secondary | ICD-10-CM | POA: Diagnosis not present

## 2023-07-23 DIAGNOSIS — Z1231 Encounter for screening mammogram for malignant neoplasm of breast: Secondary | ICD-10-CM | POA: Diagnosis not present

## 2023-07-30 DIAGNOSIS — K743 Primary biliary cirrhosis: Secondary | ICD-10-CM | POA: Diagnosis not present

## 2023-07-30 DIAGNOSIS — N189 Chronic kidney disease, unspecified: Secondary | ICD-10-CM | POA: Diagnosis not present

## 2023-07-30 DIAGNOSIS — I129 Hypertensive chronic kidney disease with stage 1 through stage 4 chronic kidney disease, or unspecified chronic kidney disease: Secondary | ICD-10-CM | POA: Diagnosis not present

## 2023-07-30 DIAGNOSIS — E872 Acidosis, unspecified: Secondary | ICD-10-CM | POA: Diagnosis not present

## 2023-07-30 DIAGNOSIS — N2581 Secondary hyperparathyroidism of renal origin: Secondary | ICD-10-CM | POA: Diagnosis not present

## 2023-07-30 DIAGNOSIS — N184 Chronic kidney disease, stage 4 (severe): Secondary | ICD-10-CM | POA: Diagnosis not present

## 2023-09-03 DIAGNOSIS — R519 Headache, unspecified: Secondary | ICD-10-CM | POA: Diagnosis not present

## 2023-09-03 DIAGNOSIS — M79651 Pain in right thigh: Secondary | ICD-10-CM | POA: Diagnosis not present

## 2023-09-03 DIAGNOSIS — W19XXXA Unspecified fall, initial encounter: Secondary | ICD-10-CM | POA: Diagnosis not present

## 2023-09-03 DIAGNOSIS — Z043 Encounter for examination and observation following other accident: Secondary | ICD-10-CM | POA: Diagnosis not present

## 2023-09-03 DIAGNOSIS — N189 Chronic kidney disease, unspecified: Secondary | ICD-10-CM | POA: Diagnosis not present

## 2023-09-03 DIAGNOSIS — M25511 Pain in right shoulder: Secondary | ICD-10-CM | POA: Diagnosis not present

## 2023-09-03 DIAGNOSIS — S7001XA Contusion of right hip, initial encounter: Secondary | ICD-10-CM | POA: Diagnosis not present

## 2023-09-03 DIAGNOSIS — M542 Cervicalgia: Secondary | ICD-10-CM | POA: Diagnosis not present

## 2023-09-03 DIAGNOSIS — Z96651 Presence of right artificial knee joint: Secondary | ICD-10-CM | POA: Diagnosis not present

## 2023-09-03 DIAGNOSIS — I129 Hypertensive chronic kidney disease with stage 1 through stage 4 chronic kidney disease, or unspecified chronic kidney disease: Secondary | ICD-10-CM | POA: Diagnosis not present

## 2023-09-03 DIAGNOSIS — M546 Pain in thoracic spine: Secondary | ICD-10-CM | POA: Diagnosis not present

## 2023-09-03 DIAGNOSIS — S0083XA Contusion of other part of head, initial encounter: Secondary | ICD-10-CM | POA: Diagnosis not present

## 2023-09-03 DIAGNOSIS — E039 Hypothyroidism, unspecified: Secondary | ICD-10-CM | POA: Diagnosis not present

## 2023-09-03 DIAGNOSIS — M25551 Pain in right hip: Secondary | ICD-10-CM | POA: Diagnosis not present

## 2023-09-03 DIAGNOSIS — Z96641 Presence of right artificial hip joint: Secondary | ICD-10-CM | POA: Diagnosis not present

## 2023-09-03 DIAGNOSIS — M25561 Pain in right knee: Secondary | ICD-10-CM | POA: Diagnosis not present

## 2023-09-30 DIAGNOSIS — Z681 Body mass index (BMI) 19 or less, adult: Secondary | ICD-10-CM | POA: Diagnosis not present

## 2023-09-30 DIAGNOSIS — N184 Chronic kidney disease, stage 4 (severe): Secondary | ICD-10-CM | POA: Diagnosis not present

## 2023-09-30 DIAGNOSIS — Z79890 Hormone replacement therapy (postmenopausal): Secondary | ICD-10-CM | POA: Diagnosis not present

## 2023-09-30 DIAGNOSIS — E7800 Pure hypercholesterolemia, unspecified: Secondary | ICD-10-CM | POA: Diagnosis not present

## 2023-09-30 DIAGNOSIS — K219 Gastro-esophageal reflux disease without esophagitis: Secondary | ICD-10-CM | POA: Diagnosis not present

## 2023-09-30 DIAGNOSIS — I700 Atherosclerosis of aorta: Secondary | ICD-10-CM | POA: Diagnosis not present

## 2023-09-30 DIAGNOSIS — E039 Hypothyroidism, unspecified: Secondary | ICD-10-CM | POA: Diagnosis not present

## 2023-09-30 DIAGNOSIS — K743 Primary biliary cirrhosis: Secondary | ICD-10-CM | POA: Diagnosis not present

## 2023-09-30 DIAGNOSIS — F329 Major depressive disorder, single episode, unspecified: Secondary | ICD-10-CM | POA: Diagnosis not present

## 2023-10-09 DIAGNOSIS — N184 Chronic kidney disease, stage 4 (severe): Secondary | ICD-10-CM | POA: Diagnosis not present

## 2023-10-15 DIAGNOSIS — K769 Liver disease, unspecified: Secondary | ICD-10-CM | POA: Diagnosis not present

## 2023-10-15 DIAGNOSIS — K7460 Unspecified cirrhosis of liver: Secondary | ICD-10-CM | POA: Diagnosis not present

## 2023-10-15 DIAGNOSIS — K745 Biliary cirrhosis, unspecified: Secondary | ICD-10-CM | POA: Diagnosis not present

## 2023-10-16 DIAGNOSIS — I129 Hypertensive chronic kidney disease with stage 1 through stage 4 chronic kidney disease, or unspecified chronic kidney disease: Secondary | ICD-10-CM | POA: Diagnosis not present

## 2023-10-16 DIAGNOSIS — K743 Primary biliary cirrhosis: Secondary | ICD-10-CM | POA: Diagnosis not present

## 2023-10-16 DIAGNOSIS — N189 Chronic kidney disease, unspecified: Secondary | ICD-10-CM | POA: Diagnosis not present

## 2023-10-16 DIAGNOSIS — E872 Acidosis, unspecified: Secondary | ICD-10-CM | POA: Diagnosis not present

## 2023-10-16 DIAGNOSIS — N2581 Secondary hyperparathyroidism of renal origin: Secondary | ICD-10-CM | POA: Diagnosis not present

## 2023-10-16 DIAGNOSIS — N184 Chronic kidney disease, stage 4 (severe): Secondary | ICD-10-CM | POA: Diagnosis not present

## 2023-10-22 DIAGNOSIS — K743 Primary biliary cirrhosis: Secondary | ICD-10-CM | POA: Diagnosis not present

## 2023-10-22 DIAGNOSIS — Z9049 Acquired absence of other specified parts of digestive tract: Secondary | ICD-10-CM | POA: Diagnosis not present

## 2023-10-22 DIAGNOSIS — K745 Biliary cirrhosis, unspecified: Secondary | ICD-10-CM | POA: Diagnosis not present

## 2023-10-31 DIAGNOSIS — Z9049 Acquired absence of other specified parts of digestive tract: Secondary | ICD-10-CM | POA: Diagnosis not present

## 2023-10-31 DIAGNOSIS — K743 Primary biliary cirrhosis: Secondary | ICD-10-CM | POA: Diagnosis not present

## 2023-10-31 DIAGNOSIS — K745 Biliary cirrhosis, unspecified: Secondary | ICD-10-CM | POA: Diagnosis not present

## 2023-11-26 DIAGNOSIS — K7460 Unspecified cirrhosis of liver: Secondary | ICD-10-CM | POA: Diagnosis not present

## 2023-12-04 DIAGNOSIS — K7460 Unspecified cirrhosis of liver: Secondary | ICD-10-CM | POA: Diagnosis not present

## 2023-12-04 DIAGNOSIS — K769 Liver disease, unspecified: Secondary | ICD-10-CM | POA: Diagnosis not present

## 2023-12-19 DIAGNOSIS — N3000 Acute cystitis without hematuria: Secondary | ICD-10-CM | POA: Diagnosis not present

## 2023-12-19 DIAGNOSIS — R81 Glycosuria: Secondary | ICD-10-CM | POA: Diagnosis not present

## 2023-12-19 DIAGNOSIS — N3091 Cystitis, unspecified with hematuria: Secondary | ICD-10-CM | POA: Diagnosis not present

## 2023-12-19 DIAGNOSIS — R300 Dysuria: Secondary | ICD-10-CM | POA: Diagnosis not present

## 2023-12-19 DIAGNOSIS — R8281 Pyuria: Secondary | ICD-10-CM | POA: Diagnosis not present

## 2023-12-30 DIAGNOSIS — K743 Primary biliary cirrhosis: Secondary | ICD-10-CM | POA: Diagnosis not present

## 2023-12-30 DIAGNOSIS — K219 Gastro-esophageal reflux disease without esophagitis: Secondary | ICD-10-CM | POA: Diagnosis not present

## 2023-12-30 DIAGNOSIS — Z79890 Hormone replacement therapy (postmenopausal): Secondary | ICD-10-CM | POA: Diagnosis not present

## 2023-12-30 DIAGNOSIS — E039 Hypothyroidism, unspecified: Secondary | ICD-10-CM | POA: Diagnosis not present

## 2023-12-30 DIAGNOSIS — F329 Major depressive disorder, single episode, unspecified: Secondary | ICD-10-CM | POA: Diagnosis not present

## 2023-12-30 DIAGNOSIS — E7800 Pure hypercholesterolemia, unspecified: Secondary | ICD-10-CM | POA: Diagnosis not present

## 2023-12-30 DIAGNOSIS — N184 Chronic kidney disease, stage 4 (severe): Secondary | ICD-10-CM | POA: Diagnosis not present

## 2023-12-30 DIAGNOSIS — I700 Atherosclerosis of aorta: Secondary | ICD-10-CM | POA: Diagnosis not present

## 2023-12-30 DIAGNOSIS — Z681 Body mass index (BMI) 19 or less, adult: Secondary | ICD-10-CM | POA: Diagnosis not present

## 2024-01-14 DIAGNOSIS — N184 Chronic kidney disease, stage 4 (severe): Secondary | ICD-10-CM | POA: Diagnosis not present

## 2024-01-20 DIAGNOSIS — N2581 Secondary hyperparathyroidism of renal origin: Secondary | ICD-10-CM | POA: Diagnosis not present

## 2024-01-20 DIAGNOSIS — N184 Chronic kidney disease, stage 4 (severe): Secondary | ICD-10-CM | POA: Diagnosis not present

## 2024-01-20 DIAGNOSIS — K743 Primary biliary cirrhosis: Secondary | ICD-10-CM | POA: Diagnosis not present

## 2024-01-20 DIAGNOSIS — E872 Acidosis, unspecified: Secondary | ICD-10-CM | POA: Diagnosis not present

## 2024-01-20 DIAGNOSIS — I129 Hypertensive chronic kidney disease with stage 1 through stage 4 chronic kidney disease, or unspecified chronic kidney disease: Secondary | ICD-10-CM | POA: Diagnosis not present

## 2024-02-24 DIAGNOSIS — R300 Dysuria: Secondary | ICD-10-CM | POA: Diagnosis not present

## 2024-02-24 DIAGNOSIS — R935 Abnormal findings on diagnostic imaging of other abdominal regions, including retroperitoneum: Secondary | ICD-10-CM | POA: Diagnosis not present

## 2024-02-24 DIAGNOSIS — N3001 Acute cystitis with hematuria: Secondary | ICD-10-CM | POA: Diagnosis not present

## 2024-02-24 DIAGNOSIS — R81 Glycosuria: Secondary | ICD-10-CM | POA: Diagnosis not present

## 2024-02-24 DIAGNOSIS — K7460 Unspecified cirrhosis of liver: Secondary | ICD-10-CM | POA: Diagnosis not present

## 2024-02-24 DIAGNOSIS — N3091 Cystitis, unspecified with hematuria: Secondary | ICD-10-CM | POA: Diagnosis not present

## 2024-03-02 DIAGNOSIS — M7662 Achilles tendinitis, left leg: Secondary | ICD-10-CM | POA: Diagnosis not present

## 2024-03-02 DIAGNOSIS — I872 Venous insufficiency (chronic) (peripheral): Secondary | ICD-10-CM | POA: Diagnosis not present

## 2024-03-02 DIAGNOSIS — G629 Polyneuropathy, unspecified: Secondary | ICD-10-CM | POA: Diagnosis not present

## 2024-03-02 DIAGNOSIS — M79672 Pain in left foot: Secondary | ICD-10-CM | POA: Diagnosis not present

## 2024-03-04 DIAGNOSIS — K7460 Unspecified cirrhosis of liver: Secondary | ICD-10-CM | POA: Diagnosis not present

## 2024-03-10 DIAGNOSIS — S300XXA Contusion of lower back and pelvis, initial encounter: Secondary | ICD-10-CM | POA: Diagnosis not present

## 2024-03-10 DIAGNOSIS — Z885 Allergy status to narcotic agent status: Secondary | ICD-10-CM | POA: Diagnosis not present

## 2024-03-10 DIAGNOSIS — S76011A Strain of muscle, fascia and tendon of right hip, initial encounter: Secondary | ICD-10-CM | POA: Diagnosis not present

## 2024-03-10 DIAGNOSIS — M79651 Pain in right thigh: Secondary | ICD-10-CM | POA: Diagnosis not present

## 2024-03-10 DIAGNOSIS — Z96641 Presence of right artificial hip joint: Secondary | ICD-10-CM | POA: Diagnosis not present

## 2024-03-10 DIAGNOSIS — W01198A Fall on same level from slipping, tripping and stumbling with subsequent striking against other object, initial encounter: Secondary | ICD-10-CM | POA: Diagnosis not present

## 2024-03-10 DIAGNOSIS — M546 Pain in thoracic spine: Secondary | ICD-10-CM | POA: Diagnosis not present

## 2024-03-10 DIAGNOSIS — S161XXA Strain of muscle, fascia and tendon at neck level, initial encounter: Secondary | ICD-10-CM | POA: Diagnosis not present

## 2024-03-10 DIAGNOSIS — M25551 Pain in right hip: Secondary | ICD-10-CM | POA: Diagnosis not present

## 2024-03-10 DIAGNOSIS — M542 Cervicalgia: Secondary | ICD-10-CM | POA: Diagnosis not present

## 2024-03-10 DIAGNOSIS — S46911A Strain of unspecified muscle, fascia and tendon at shoulder and upper arm level, right arm, initial encounter: Secondary | ICD-10-CM | POA: Diagnosis not present

## 2024-03-10 DIAGNOSIS — M25511 Pain in right shoulder: Secondary | ICD-10-CM | POA: Diagnosis not present

## 2024-04-06 DIAGNOSIS — N184 Chronic kidney disease, stage 4 (severe): Secondary | ICD-10-CM | POA: Diagnosis not present

## 2024-04-08 DIAGNOSIS — H353131 Nonexudative age-related macular degeneration, bilateral, early dry stage: Secondary | ICD-10-CM | POA: Diagnosis not present

## 2024-04-13 DIAGNOSIS — F3340 Major depressive disorder, recurrent, in remission, unspecified: Secondary | ICD-10-CM | POA: Diagnosis not present

## 2024-04-13 DIAGNOSIS — Z1389 Encounter for screening for other disorder: Secondary | ICD-10-CM | POA: Diagnosis not present

## 2024-04-13 DIAGNOSIS — N184 Chronic kidney disease, stage 4 (severe): Secondary | ICD-10-CM | POA: Diagnosis not present

## 2024-04-13 DIAGNOSIS — Z0000 Encounter for general adult medical examination without abnormal findings: Secondary | ICD-10-CM | POA: Diagnosis not present

## 2024-04-13 DIAGNOSIS — Z96653 Presence of artificial knee joint, bilateral: Secondary | ICD-10-CM | POA: Diagnosis not present

## 2024-04-15 DIAGNOSIS — N184 Chronic kidney disease, stage 4 (severe): Secondary | ICD-10-CM | POA: Diagnosis not present

## 2024-04-15 DIAGNOSIS — N2581 Secondary hyperparathyroidism of renal origin: Secondary | ICD-10-CM | POA: Diagnosis not present

## 2024-04-15 DIAGNOSIS — K745 Biliary cirrhosis, unspecified: Secondary | ICD-10-CM | POA: Diagnosis not present

## 2024-04-15 DIAGNOSIS — R609 Edema, unspecified: Secondary | ICD-10-CM | POA: Diagnosis not present

## 2024-04-15 DIAGNOSIS — I129 Hypertensive chronic kidney disease with stage 1 through stage 4 chronic kidney disease, or unspecified chronic kidney disease: Secondary | ICD-10-CM | POA: Diagnosis not present
